# Patient Record
Sex: Male | Born: 1945 | Race: White | Hispanic: No | Marital: Married | Smoking: Former smoker
Health system: Southern US, Community
[De-identification: ages and names within clinical notes are randomized; demographics above are authoritative.]

## PROBLEM LIST (undated history)

## (undated) DIAGNOSIS — I96 Gangrene, not elsewhere classified: Secondary | ICD-10-CM

## (undated) DIAGNOSIS — J189 Pneumonia, unspecified organism: Secondary | ICD-10-CM

## (undated) DIAGNOSIS — C801 Malignant (primary) neoplasm, unspecified: Secondary | ICD-10-CM

## (undated) DIAGNOSIS — Z9981 Dependence on supplemental oxygen: Secondary | ICD-10-CM

## (undated) DIAGNOSIS — J449 Chronic obstructive pulmonary disease, unspecified: Secondary | ICD-10-CM

## (undated) DIAGNOSIS — S3992XA Unspecified injury of lower back, initial encounter: Secondary | ICD-10-CM

## (undated) DIAGNOSIS — I10 Essential (primary) hypertension: Secondary | ICD-10-CM

## (undated) DIAGNOSIS — J90 Pleural effusion, not elsewhere classified: Secondary | ICD-10-CM

## (undated) HISTORY — PX: ABDOMINAL SURGERY: SHX537

## (undated) HISTORY — PX: APPENDECTOMY: SHX54

---

## 1992-02-24 DIAGNOSIS — S3992XA Unspecified injury of lower back, initial encounter: Secondary | ICD-10-CM

## 1992-02-24 HISTORY — DX: Unspecified injury of lower back, initial encounter: S39.92XA

## 2012-06-17 ENCOUNTER — Emergency Department (HOSPITAL_COMMUNITY): Payer: BC Managed Care – PPO

## 2012-06-17 ENCOUNTER — Inpatient Hospital Stay (HOSPITAL_COMMUNITY)
Admission: EM | Admit: 2012-06-17 | Discharge: 2012-06-21 | DRG: 541 | Disposition: A | Discharge status: Home / Self Care | Payer: BC Managed Care – PPO | Attending: Internal Medicine | Admitting: Internal Medicine

## 2012-06-17 ENCOUNTER — Encounter (HOSPITAL_COMMUNITY): Payer: Self-pay

## 2012-06-17 DIAGNOSIS — J90 Pleural effusion, not elsewhere classified: Secondary | ICD-10-CM | POA: Diagnosis present

## 2012-06-17 DIAGNOSIS — F17200 Nicotine dependence, unspecified, uncomplicated: Secondary | ICD-10-CM | POA: Diagnosis present

## 2012-06-17 DIAGNOSIS — J9600 Acute respiratory failure, unspecified whether with hypoxia or hypercapnia: Principal | ICD-10-CM | POA: Diagnosis present

## 2012-06-17 DIAGNOSIS — J811 Chronic pulmonary edema: Secondary | ICD-10-CM | POA: Diagnosis present

## 2012-06-17 DIAGNOSIS — I214 Non-ST elevation (NSTEMI) myocardial infarction: Secondary | ICD-10-CM | POA: Diagnosis present

## 2012-06-17 DIAGNOSIS — R634 Abnormal weight loss: Secondary | ICD-10-CM | POA: Diagnosis present

## 2012-06-17 DIAGNOSIS — I4891 Unspecified atrial fibrillation: Secondary | ICD-10-CM | POA: Diagnosis present

## 2012-06-17 DIAGNOSIS — Z681 Body mass index (BMI) 19 or less, adult: Secondary | ICD-10-CM

## 2012-06-17 DIAGNOSIS — Z720 Tobacco use: Secondary | ICD-10-CM

## 2012-06-17 DIAGNOSIS — I10 Essential (primary) hypertension: Secondary | ICD-10-CM | POA: Diagnosis present

## 2012-06-17 DIAGNOSIS — R748 Abnormal levels of other serum enzymes: Secondary | ICD-10-CM | POA: Diagnosis present

## 2012-06-17 DIAGNOSIS — J441 Chronic obstructive pulmonary disease with (acute) exacerbation: Secondary | ICD-10-CM | POA: Diagnosis present

## 2012-06-17 HISTORY — DX: Unspecified injury of lower back, initial encounter: S39.92XA

## 2012-06-17 HISTORY — DX: Gangrene, not elsewhere classified: I96

## 2012-06-17 HISTORY — DX: Pneumonia, unspecified organism: J18.9

## 2012-06-17 LAB — BASIC METABOLIC PANEL
BUN: 20 mg/dL (ref 6–23)
Chloride: 97 mEq/L (ref 96–112)
Creatinine, Ser: 0.83 mg/dL (ref 0.50–1.35)
Glucose, Bld: 114 mg/dL — ABNORMAL HIGH (ref 70–99)
Potassium: 3.8 mEq/L (ref 3.5–5.1)

## 2012-06-17 LAB — CBC WITH DIFFERENTIAL/PLATELET
Eosinophils Absolute: 0.1 10*3/uL (ref 0.0–0.7)
HCT: 45.7 % (ref 39.0–52.0)
Hemoglobin: 15.2 g/dL (ref 13.0–17.0)
Lymphs Abs: 1.2 10*3/uL (ref 0.7–4.0)
MCH: 33.3 pg (ref 26.0–34.0)
Monocytes Absolute: 0.6 10*3/uL (ref 0.1–1.0)
Monocytes Relative: 7 % (ref 3–12)
Neutro Abs: 6.5 10*3/uL (ref 1.7–7.7)
Neutrophils Relative %: 78 % — ABNORMAL HIGH (ref 43–77)
RBC: 4.56 MIL/uL (ref 4.22–5.81)

## 2012-06-17 LAB — TROPONIN I: Troponin I: 0.33 ng/mL (ref <0.30)

## 2012-06-17 MED ORDER — HEPARIN BOLUS VIA INFUSION
4000.0000 [IU] | Freq: Once | INTRAVENOUS | Status: AC
  Administered 2012-06-17: 4000 [IU] via INTRAVENOUS

## 2012-06-17 MED ORDER — LABETALOL HCL 5 MG/ML IV SOLN
10.0000 mg | Freq: Once | INTRAVENOUS | Status: AC
  Administered 2012-06-17: 10 mg via INTRAVENOUS
  Filled 2012-06-17: qty 4

## 2012-06-17 MED ORDER — FUROSEMIDE 10 MG/ML IJ SOLN
40.0000 mg | Freq: Once | INTRAMUSCULAR | Status: AC
  Administered 2012-06-17: 40 mg via INTRAVENOUS
  Filled 2012-06-17: qty 4

## 2012-06-17 MED ORDER — LEVOFLOXACIN 500 MG PO TABS
500.0000 mg | ORAL_TABLET | Freq: Once | ORAL | Status: AC
  Administered 2012-06-17: 500 mg via ORAL
  Filled 2012-06-17: qty 1

## 2012-06-17 MED ORDER — HYDRALAZINE HCL 20 MG/ML IJ SOLN
10.0000 mg | Freq: Once | INTRAMUSCULAR | Status: AC
  Administered 2012-06-17: 10 mg via INTRAVENOUS
  Filled 2012-06-17: qty 1

## 2012-06-17 MED ORDER — NITROGLYCERIN 2 % TD OINT
1.0000 [in_us] | TOPICAL_OINTMENT | Freq: Four times a day (QID) | TRANSDERMAL | Status: DC
  Administered 2012-06-17 – 2012-06-20 (×11): 1 [in_us] via TOPICAL
  Filled 2012-06-17: qty 1
  Filled 2012-06-17: qty 30

## 2012-06-17 MED ORDER — HEPARIN (PORCINE) IN NACL 100-0.45 UNIT/ML-% IJ SOLN
800.0000 [IU]/h | INTRAMUSCULAR | Status: DC
  Administered 2012-06-17: 650 [IU]/h via INTRAVENOUS
  Administered 2012-06-18: 800 [IU]/h via INTRAVENOUS
  Filled 2012-06-17: qty 250

## 2012-06-17 MED ORDER — ASPIRIN 81 MG PO CHEW
324.0000 mg | CHEWABLE_TABLET | Freq: Once | ORAL | Status: AC
  Administered 2012-06-17: 324 mg via ORAL
  Filled 2012-06-17: qty 4

## 2012-06-17 NOTE — ED Notes (Signed)
EMS reports pt has had some increasing SOB for the past 2 months.  Reports went to pcp today.  PCP called EMS because pt's o2sat in 70's and BP elevated.  EMS administered 125mg  solumedrol IV, 10mg  albuterol neb, and 0.5mg  atrovent neb enroute.  Pt alert, oriented, denies any pain.  Reports is breathing easier but still labored.  BP 213/107 in ED>  duoneb still in progress.

## 2012-06-17 NOTE — Progress Notes (Signed)
ANTICOAGULATION CONSULT NOTE - Initial Consult  Pharmacy Consult for Heparin Indication: chest pain/ACS  No Known Allergies  Patient Measurements: Height: 5\' 8"  (172.7 cm) Weight: 122 lb (55.339 kg) IBW/kg (Calculated) : 68.4  Vital Signs: Temp: 98.2 F (36.8 C) (04/25 1557) Temp src: Oral (04/25 1854) BP: 164/125 mmHg (04/25 1951) Pulse Rate: 71 (04/25 1951)  Labs:  Recent Labs  06/17/12 1607  HGB 15.2  HCT 45.7  PLT 166  CREATININE 0.83  TROPONINI 0.42*   Estimated Creatinine Clearance: 69.4 ml/min (by C-G formula based on Cr of 0.83).  Medical History: Past Medical History  Diagnosis Date  . Pneumonia   . Gangrene   . Back injury 1994   Medications:  Infusions:  . heparin 650 Units/hr (06/17/12 1940)  . [COMPLETED] heparin Stopped (06/17/12 1944)   Assessment: 67yo male started on IV heparin for ACS.  Platelets OK. Goal of Therapy:  Heparin level 0.3-0.7 units/ml Monitor platelets by anticoagulation protocol: Yes   Plan:  Give 4000 units bolus x 1 Start heparin infusion at 650 units/hr Check anti-Xa level in 6 hours and daily while on heparin Continue to monitor H&H and platelets  Valrie Hart A 06/17/2012,7:52 PM

## 2012-06-17 NOTE — ED Notes (Signed)
Pt's breathing treatment complete, pt on 02 at 2liters,  02 sat 100% at present.  Will continue to monitor.

## 2012-06-17 NOTE — H&P (Signed)
[facility] History and Physical  Aldridge Krzyzanowski ZOX:[id number] DOB: [birth year] DOA: 06/17/2012   PCP: He does not have a PCP but was seen by a provider at [facility] today Specialists: None  Chief Complaint: Shortness of breath, worsening over the last 3 months  HPI: Dominic Martinez is a 67 y.o. male with a past medical history that is unremarkable because the patient doesn't follow up with the any providers. He used to take medications for high blood pressure till about 15-20 years ago, but then he stopped taking it. He also has a history of back injury about 20 years ago. Patient presents with complaints of worsening shortness of breath over the last 2-3 months. 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 He just couldn't do it and was getting more short of breath. So, he went to see a provider today, and he was referred to the emergency department. He has had a cough with thick, whitish expectoration. Denies any yellow sputum. Denies any blood in the sputum. Denies any fever or chills. Has had wheezing. He also has had left-sided chest pain 4 year to which comes and goes. It's relieved by rubbing that area. Not particularly associated with exertion. The pain is achy pain in his cell currently 4/10 in intensity. Denies any leg swelling. No dizziness. Doesn't take any medications at home. Continues to smoke cigarettes.  Home Medications: Prior to Admission medications   Medication Sig Start Date End Date Taking? Authorizing Provider  acetaminophen (TYLENOL) 500 MG tablet Take 500-1,000 mg by mouth daily as needed for pain (Only used on occasion for pain).   Yes Historical Provider, MD    Allergies: No Known Allergies  Past Medical History: Past Medical History  Diagnosis Date  . Pneumonia   . Gangrene   . Back injury 1994    Past Surgical History  Procedure Laterality Date  .  Appendectomy    . Abdominal surgery      Social History:  reports that he has been smoking.  He does not have any smokeless tobacco history on file. He reports that he does not drink alcohol or use illicit drugs.  Living Situation: Lives with his wife Activity Level: Usually independent with daily activities, but less so, in the last couple of months   Family History:  Family History  Problem Relation Age of Onset  . Heart attack Brother   . Coronary artery disease Brother   . Cancer Brother      Review of Systems - History obtained from the patient General ROS: positive for  - fatigue Psychological ROS: negative Ophthalmic ROS: negative ENT ROS: negative Allergy and Immunology ROS: negative Hematological and Lymphatic ROS: negative Endocrine ROS: negative Respiratory ROS: as in hpi Cardiovascular ROS: as in hpi Gastrointestinal ROS: no abdominal pain, change in bowel habits, or black or bloody stools Genito-Urinary ROS: no dysuria, trouble voiding, or hematuria Musculoskeletal ROS: negative Neurological ROS: no TIA or stroke symptoms Dermatological ROS: negative  Physical Examination  Filed Vitals:   06/17/12 1900 06/17/12 1906 06/17/12 1920 06/17/12 1951  BP: 198/109 202/96 198/98 164/125  Pulse: 84 79 75 71  Temp:      TempSrc:      Resp: 28 22 27 26   Height:      Weight:      SpO2: 92% 93% 93% 93%    General appearance: alert, cooperative,  appears stated age and no distress Head: Normocephalic, without obvious abnormality, atraumatic Eyes: conjunctivae/corneas clear. PERRL, EOM's intact.  Throat: lips, mucosa, and tongue normal; teeth and gums normal Neck: no adenopathy, no carotid bruit, no JVD, supple, symmetrical, trachea midline and thyroid not enlarged, symmetric, no tenderness/mass/nodules Back: symmetric, no curvature. ROM normal. No CVA tenderness. Resp: Few wheezes bilaterally. Crackles bilateral bases. Few rhonchi as well Chest wall: no  tenderness Cardio: regular rate and rhythm, S1, S2 normal, no murmur, click, rub or gallop GI: soft, non-tender; bowel sounds normal; no masses,  no organomegaly Extremities: edema Very minimal edema Pulses: 2+ and symmetric Skin: Skin color, texture, turgor normal. No rashes or lesions Lymph nodes: Cervical, supraclavicular, and axillary nodes normal. Neurologic: He is alert and oriented x3. No focal neurological deficits are present.  Laboratory Data: Results for orders placed during the hospital encounter of 06/17/12 (from the past 48 hour(s))  CBC WITH DIFFERENTIAL     Status: Abnormal   Collection Time    06/17/12  4:07 PM      Result Value Range   WBC 8.4  4.0 - 10.5 K/uL   RBC 4.56  4.22 - 5.81 MIL/uL   Hemoglobin 15.2  13.0 - 17.0 g/dL   HCT 16.1  09.6 - 04.5 %   MCV 100.2 (*) 78.0 - 100.0 fL   MCH 33.3  26.0 - 34.0 pg   MCHC 33.3  30.0 - 36.0 g/dL   RDW 40.9  81.1 - 91.4 %   Platelets 166  150 - 400 K/uL   Neutrophils Relative 78 (*) 43 - 77 %   Neutro Abs 6.5  1.7 - 7.7 K/uL   Lymphocytes Relative 14  12 - 46 %   Lymphs Abs 1.2  0.7 - 4.0 K/uL   Monocytes Relative 7  3 - 12 %   Monocytes Absolute 0.6  0.1 - 1.0 K/uL   Eosinophils Relative 1  0 - 5 %   Eosinophils Absolute 0.1  0.0 - 0.7 K/uL   Basophils Relative 0  0 - 1 %   Basophils Absolute 0.0  0.0 - 0.1 K/uL  BASIC METABOLIC PANEL     Status: Abnormal   Collection Time    06/17/12  4:07 PM      Result Value Range   Sodium 137  135 - 145 mEq/L   Potassium 3.8  3.5 - 5.1 mEq/L   Chloride 97  96 - 112 mEq/L   CO2 35 (*) 19 - 32 mEq/L   Glucose, Bld 114 (*) 70 - 99 mg/dL   BUN 20  6 - 23 mg/dL   Creatinine, Ser 7.82  0.50 - 1.35 mg/dL   Calcium 8.8  8.4 - 95.6 mg/dL   GFR calc non Af Amer >90  >90 mL/min   GFR calc Af Amer >90  >90 mL/min   Comment:            The eGFR has been calculated     using the CKD EPI equation.     This calculation has not been     validated in all clinical     situations.      eGFR's persistently     <90 mL/min signify     possible Chronic Kidney Disease.  TROPONIN I     Status: Abnormal   Collection Time    06/17/12  4:07 PM      Result Value Range   Troponin I 0.42 (*) <0.30 ng/mL  Comment:            Due to the release kinetics of cTnI,     a negative result within the first hours     of the onset of symptoms does not rule out     myocardial infarction with certainty.     If myocardial infarction is still suspected,     repeat the test at appropriate intervals.     CRITICAL RESULT CALLED TO, READ BACK BY AND VERIFIED WITH:     HUTCHENS,L ON 06/17/12 AT 1705 BY LOY,C  PRO B NATRIURETIC PEPTIDE     Status: Abnormal   Collection Time    06/17/12  4:07 PM      Result Value Range   Pro B Natriuretic peptide (BNP) 1160.0 (*) 0 - 125 pg/mL    Radiology Reports: Ct Chest Wo Contrast  06/17/2012  *RADIOLOGY REPORT*  Clinical Data: Hypoxia  CT CHEST WITHOUT CONTRAST  Technique:  Multidetector CT imaging of the chest was performed following the standard protocol without IV contrast.  Comparison: Chest radiograph same date  Findings: There is pulmonary hyperinflation with diffuse severe emphysematous change.  Small right and trace left pleural effusions are noted.  There are areas of patchy airspace opacity predominately involving the right upper lobe but also in other lobes, with areas of bronchial wall thickening and interlobular septal thickening.  Heart size is normal.  Multiple prominent mediastinal and hilar lymph nodes are identified, representative pretracheal node 1.1 cm.  Mild atheromatous aortic calcification is present.  Pectus excavatum deformity is noted.  Degenerative changes are noted in the spine.  Age indeterminate T11 compression fracture noted with lower thoracic spine central endplate mild compression deformity.  IMPRESSION: Evidence of severe COPD with superimposed ground-glass airspace opacities and pleural effusions, most likely indicating  superimposed edema.  Acute on chronic bronchitis and/or infectious/inflammatory airspace disease could have a similar appearance.   Original Report Authenticated By: Christiana Pellant, M.D.    Dg Chest Portable 1 View  06/17/2012  *RADIOLOGY REPORT*  Clinical Data: Shortness of breath.  Hypertension.  PORTABLE CHEST - 1 VIEW  Comparison: No priors.  Findings: There are coarse interstitial markings throughout the upper lobes of the lungs bilaterally, particularly in the apices. Coarse interstitial markings are also noted throughout the periphery of the lungs bilaterally, particularly in the lung bases where there are multiple Kerley B lines.  There does not appear to be cephalization of the pulmonary vasculature to suggest that these findings are related to pulmonary edema at this time. Additionally, heart size is normal.  The possibility of air space disease in the upper lobes of the lungs bilaterally and at the right base medially is not excluded.  No definite pleural effusions.  Atherosclerosis of the thoracic aorta.  IMPRESSION: 1.  Unusual appearance of the lung parenchyma, as above.  Although there is a possibility of acute multilobar pneumonia involving predominately the upper lobes of the lungs and the medial right base, findings have an appearance suggestive of a chronic process, potentially interstitial lung disease. Clinical correlation is recommended, and further evaluation with high-resolution chest CT is suggested to better evaluate these findings. 2.  Atherosclerosis.   Original Report Authenticated By: Trudie Reed, M.D.     Electrocardiogram: EKG shows sinus rhythm at 79 beats per minute. Normal axis. Normal intervals. T inversion in lead V3. No older EKG available for comparison.  Problem List  Principal Problem:   COPD with acute exacerbation Active Problems:   NSTEMI (  non-ST elevated myocardial infarction)   Accelerated hypertension   Pulmonary edema   Assessment: This is a  67 year old, Caucasian male, who looks older than his stated age, presents with worsening shortness of breath over the last 2-3 months. The etiology for his symptoms is multifactorial. There is a component of COPD exacerbation. But CT shows pleural effusions bilaterally. This suggests more of CHF/pulmonary edema pattern. His BNP is also elevated.  Plan: #1 dyspnea most likely due to a combination of COPD exacerbation and pulmonary edema. He will be treated with nebulizer treatments, steroids, antibiotics, and will be given Lasix intravenously. Please see below for the rest of the plan.  #2 COPD exacerbation, acute: Nebulizer treatments, steroids, and antibiotics will be provided. Smoking cessation counseling has been provided. Nicotine patch will be utilized for his tobacco abuse. He may benefit from inhaled steroids as well and this can be considered in the next few days.  #3 possible non-STEMI with Pulmonary edema: Troponin will be repeated. He's been given aspirin. Will provide Beta blockers. Heparin has been initiated as well. Have discussed with cardiology fellow on call and he will evaluate patient when he arrives at [facility]. He'll be transferred to [facility] due to lack of cardiology coverage here over the weekend. EKG shows nonspecific findings. Echocardiogram will be obtained. Lipid panel will also be checked. ACEI can be considered if he has systolic dysfunction.  #4 accelerated hypertension: He'll be given nitroglycerin ointment to begin with. He'll be given hydralazine on as needed basis. Beta blockers will be initiated. If blood pressures not well controlled despite these interventions he may require a nitroglycerin infusion. But should be cautious with reducing his pressure too aggressively, because it's possible the patient may be living with a high blood pressure as he has not been seen by any doctor in many many years and does carry a history of hypertension.  #5 tobacco abuse: As above.  Nicotine patch.  DVT Prophylaxis: He'll be on intravenous heparin Code Status: Full code Family Communication: Discussed with the patient and his daughters  Disposition Plan: Transfer to [facility].   Further management decisions will depend on results of further testing and patient's response to treatment.  [facility]  [facility] Pager [phone]  If 7PM-7AM, please contact night-coverage [email or web address] Password [facility]  06/17/2012, 7:59 PM

## 2012-06-17 NOTE — ED Provider Notes (Signed)
History     CSN: [id number]  Arrival date & time 06/17/12  1556   First MD Initiated Contact with Patient 06/17/12 1615      Chief Complaint  Patient presents with  . Shortness of Breath  . Hypertension    (Consider location/radiation/quality/duration/timing/severity/associated sxs/prior treatment) HPI Patient presents with dyspnea, both at rest and with exertion.  Symptoms began approximately 2 months ago, without clear precipitant, but he notes one prior episode of pneumonia. Initially symptoms were minor, but over the interval symptoms have become incapacitating.  There is significant dyspnea on exertion, though no pain, no fever, no cough. The patient smokes, was counseled to stop. The patient saw a physician today, was sent here for further evaluation and management after he was hypoxic in the office with oxygen saturation in the 70s.  Additionally, the patient is hypertensive, with a systolic greater than 200, diastolic greater than 100. Past Medical History  Diagnosis Date  . Pneumonia   . Gangrene     Past Surgical History  Procedure Laterality Date  . Appendectomy    . Abdominal surgery      No family history on file.  History  Substance Use Topics  . Smoking status: Current Every Day Smoker  . Smokeless tobacco: Not on file  . Alcohol Use: No      Review of Systems  Constitutional:       Per HPI, otherwise negative  HENT:       Per HPI, otherwise negative  Respiratory:       Per HPI, otherwise negative  Cardiovascular:       Per HPI, otherwise negative  Gastrointestinal: Negative for vomiting.  Endocrine:       Negative aside from HPI  Genitourinary:       Neg aside from HPI   Musculoskeletal:       Per HPI, otherwise negative  Skin: Negative.   Neurological: Negative for syncope.    Allergies  Review of patient's allergies indicates no known allergies.  Home Medications  No current outpatient prescriptions on file.  BP 213/107  Pulse  76  Temp(Src) 98.2 F (36.8 C) (Oral)  Resp 24  Ht 5\' 8"  (1.727 m)  Wt 122 lb (55.339 kg)  BMI 18.55 kg/m2  SpO2 99%  Physical Exam  Nursing note and vitals reviewed. Constitutional: He is oriented to person, place, and time. He appears well-developed. No distress.  HENT:  Head: Normocephalic and atraumatic.  Eyes: Conjunctivae and EOM are normal.  Cardiovascular: Normal rate and regular rhythm.   Pulmonary/Chest: No stridor. Tachypnea noted. No respiratory distress. He has decreased breath sounds in the right upper field and the right lower field.  Pectus excavatum  Abdominal: He exhibits no distension.  Musculoskeletal: He exhibits no edema.  Neurological: He is alert and oriented to person, place, and time.  Skin: Skin is warm and dry.  Psychiatric: He has a normal mood and affect.    ED Course  Procedures (including critical care time)  Labs Reviewed  CBC WITH DIFFERENTIAL - Abnormal; Notable for the following:    MCV 100.2 (*)    Neutrophils Relative 78 (*)    All other components within normal limits  BASIC METABOLIC PANEL  TROPONIN I  PRO B NATRIURETIC PEPTIDE   Dg Chest Portable 1 View  06/17/2012  *RADIOLOGY REPORT*  Clinical Data: Shortness of breath.  Hypertension.  PORTABLE CHEST - 1 VIEW  Comparison: No priors.  Findings: There are coarse interstitial markings throughout the  upper lobes of the lungs bilaterally, particularly in the apices. Coarse interstitial markings are also noted throughout the periphery of the lungs bilaterally, particularly in the lung bases where there are multiple Kerley B lines.  There does not appear to be cephalization of the pulmonary vasculature to suggest that these findings are related to pulmonary edema at this time. Additionally, heart size is normal.  The possibility of air space disease in the upper lobes of the lungs bilaterally and at the right base medially is not excluded.  No definite pleural effusions.  Atherosclerosis of the  thoracic aorta.  IMPRESSION: 1.  Unusual appearance of the lung parenchyma, as above.  Although there is a possibility of acute multilobar pneumonia involving predominately the upper lobes of the lungs and the medial right base, findings have an appearance suggestive of a chronic process, potentially interstitial lung disease. Clinical correlation is recommended, and further evaluation with high-resolution chest CT is suggested to better evaluate these findings. 2.  Atherosclerosis.   Original Report Authenticated By: Trudie Reed, M.D.      No diagnosis found.   Pulse ox 96% with supplemental oxygen abnormal Cardiac 75 sinus rhythm normal     Date: 06/17/2012  Rate: 79  Rhythm: normal sinus rhythm  QRS Axis: normal  Intervals: normal  ST/T Wave abnormalities: nonspecific T wave changes  Conduction Disutrbances:none  Narrative Interpretation:   Old EKG Reviewed: none available ABNORMAL  Initial CXR abnormal - CT ordered   Update: Patient appears calm, though he is still tachycardic, hypertensive.  Update: Patient's troponin is 0.4.  ECG is nonischemic, though there are unremarkable T wave changes, and he is hypertensive, there is some suspicion of demand ischemia.  Patient will receive heparin, aspirin, beta blocker.  Nitro paste applied as well.  MDM  This elderly male with long-term smoking history, recent progression of dyspnea now presents hypertensive, tachypneic, hypoxic.  The patient's ECG is not overtly ischemic, but his troponin is elevated.  X-ray does not demonstrate pneumonia, but her change consistent with interstitial lung disease or bronchitis.  Given the significance of his medical condition, he was admitted for further evaluation and management after initiation of anticoagulant, beta blocker therapy.   CRITICAL CARE Performed by: Gerhard Munch   Total critical care time: 40  Critical care time was exclusive of separately billable procedures and  treating other patients.  Critical care was necessary to treat or prevent imminent or life-threatening deterioration.  Critical care was time spent personally by me on the following activities: development of treatment plan with patient and/or surrogate as well as nursing, discussions with consultants, evaluation of patient's response to treatment, examination of patient, obtaining history from patient or surrogate, ordering and performing treatments and interventions, ordering and review of laboratory studies, ordering and review of radiographic studies, pulse oximetry and re-evaluation of patient's condition.   Gerhard Munch, MD 06/17/12 2014

## 2012-06-17 NOTE — ED Notes (Signed)
Spoke with danielle with [facility] and she states the truck is enroute to our facility at this time.

## 2012-06-18 DIAGNOSIS — J9600 Acute respiratory failure, unspecified whether with hypoxia or hypercapnia: Principal | ICD-10-CM

## 2012-06-18 DIAGNOSIS — Z720 Tobacco use: Secondary | ICD-10-CM | POA: Diagnosis present

## 2012-06-18 DIAGNOSIS — R7989 Other specified abnormal findings of blood chemistry: Secondary | ICD-10-CM

## 2012-06-18 DIAGNOSIS — I509 Heart failure, unspecified: Secondary | ICD-10-CM

## 2012-06-18 DIAGNOSIS — I10 Essential (primary) hypertension: Secondary | ICD-10-CM

## 2012-06-18 DIAGNOSIS — I214 Non-ST elevation (NSTEMI) myocardial infarction: Secondary | ICD-10-CM

## 2012-06-18 DIAGNOSIS — J811 Chronic pulmonary edema: Secondary | ICD-10-CM

## 2012-06-18 LAB — COMPREHENSIVE METABOLIC PANEL
AST: 16 U/L (ref 0–37)
CO2: 34 mEq/L — ABNORMAL HIGH (ref 19–32)
Calcium: 9 mg/dL (ref 8.4–10.5)
Creatinine, Ser: 0.8 mg/dL (ref 0.50–1.35)
GFR calc non Af Amer: >90 mL/min (ref >90)
Total Protein: 6.9 g/dL (ref 6.0–8.3)

## 2012-06-18 LAB — CBC
HCT: 45.2 % (ref 39.0–52.0)
Hemoglobin: 15.2 g/dL (ref 13.0–17.0)
MCH: 32 pg (ref 26.0–34.0)
MCHC: 33.6 g/dL (ref 30.0–36.0)

## 2012-06-18 LAB — TROPONIN I
Troponin I: 0.3 ng/mL (ref <0.30)
Troponin I: <0.3 ng/mL (ref <0.30)
Troponin I: <0.3 ng/mL (ref <0.30)

## 2012-06-18 LAB — LIPID PANEL
HDL: 42 mg/dL (ref >39)
LDL Cholesterol: 85 mg/dL (ref 0–99)
Total CHOL/HDL Ratio: 3.2 RATIO
Triglycerides: 45 mg/dL (ref <150)

## 2012-06-18 LAB — TSH: TSH: 0.381 u[IU]/mL (ref 0.350–4.500)

## 2012-06-18 LAB — MRSA PCR SCREENING: MRSA by PCR: NEGATIVE

## 2012-06-18 MED ORDER — PNEUMOCOCCAL VAC POLYVALENT 25 MCG/0.5ML IJ INJ
0.5000 mL | INJECTION | INTRAMUSCULAR | Status: DC
  Filled 2012-06-18: qty 0.5

## 2012-06-18 MED ORDER — ASPIRIN EC 81 MG PO TBEC
81.0000 mg | DELAYED_RELEASE_TABLET | Freq: Every day | ORAL | Status: DC
  Administered 2012-06-18 – 2012-06-21 (×4): 81 mg via ORAL
  Filled 2012-06-18 (×4): qty 1

## 2012-06-18 MED ORDER — PANTOPRAZOLE SODIUM 40 MG PO TBEC
40.0000 mg | DELAYED_RELEASE_TABLET | Freq: Every day | ORAL | Status: DC
  Administered 2012-06-18 – 2012-06-20 (×3): 40 mg via ORAL
  Filled 2012-06-18 (×3): qty 1

## 2012-06-18 MED ORDER — MORPHINE SULFATE 2 MG/ML IJ SOLN: 2.0000 mg | INTRAMUSCULAR | Status: DC | PRN

## 2012-06-18 MED ORDER — ONDANSETRON HCL 4 MG/2ML IJ SOLN: 4.0000 mg | Freq: Four times a day (QID) | INTRAMUSCULAR | Status: DC | PRN

## 2012-06-18 MED ORDER — HEPARIN BOLUS VIA INFUSION
1500.0000 [IU] | Freq: Once | INTRAVENOUS | Status: AC
  Administered 2012-06-18: 1500 [IU] via INTRAVENOUS
  Filled 2012-06-18: qty 1500

## 2012-06-18 MED ORDER — ACETAMINOPHEN 325 MG PO TABS: 650.0000 mg | ORAL_TABLET | Freq: Four times a day (QID) | ORAL | Status: DC | PRN

## 2012-06-18 MED ORDER — CHRONIC LUNG DISEASE PATIENT EDUCATION BOOK
Freq: Once | Status: AC
Start: 2012-06-18 — End: 2012-06-18
  Administered 2012-06-18: 15:00:00
  Filled 2012-06-18: qty 1

## 2012-06-18 MED ORDER — ALBUTEROL SULFATE (5 MG/ML) 0.5% IN NEBU
2.5000 mg | INHALATION_SOLUTION | RESPIRATORY_TRACT | Status: DC
  Administered 2012-06-18 – 2012-06-20 (×15): 2.5 mg via RESPIRATORY_TRACT
  Filled 2012-06-18 (×16): qty 0.5

## 2012-06-18 MED ORDER — GUAIFENESIN-DM 100-10 MG/5ML PO SYRP
5.0000 mL | ORAL_SOLUTION | ORAL | Status: DC | PRN
  Filled 2012-06-18: qty 5

## 2012-06-18 MED ORDER — METOPROLOL TARTRATE 25 MG PO TABS
25.0000 mg | ORAL_TABLET | Freq: Two times a day (BID) | ORAL | Status: DC
  Administered 2012-06-18 – 2012-06-20 (×6): 25 mg via ORAL
  Filled 2012-06-18 (×7): qty 1

## 2012-06-18 MED ORDER — SODIUM CHLORIDE 0.9 % IJ SOLN
3.0000 mL | Freq: Two times a day (BID) | INTRAMUSCULAR | Status: DC
  Administered 2012-06-18 – 2012-06-21 (×5): 3 mL via INTRAVENOUS

## 2012-06-18 MED ORDER — ONDANSETRON HCL 4 MG PO TABS: 4.0000 mg | ORAL_TABLET | Freq: Four times a day (QID) | ORAL | Status: DC | PRN

## 2012-06-18 MED ORDER — ATORVASTATIN CALCIUM 80 MG PO TABS
80.0000 mg | ORAL_TABLET | Freq: Every day | ORAL | Status: DC
  Administered 2012-06-18 – 2012-06-21 (×5): 80 mg via ORAL
  Filled 2012-06-18 (×5): qty 1

## 2012-06-18 MED ORDER — PNEUMOCOCCAL VAC POLYVALENT 25 MCG/0.5ML IJ INJ: 0.5000 mL | INJECTION | INTRAMUSCULAR | Status: DC | PRN

## 2012-06-18 MED ORDER — OXYCODONE HCL 5 MG PO TABS: 5.0000 mg | ORAL_TABLET | ORAL | Status: DC | PRN

## 2012-06-18 MED ORDER — ACETAMINOPHEN 650 MG RE SUPP: 650.0000 mg | Freq: Four times a day (QID) | RECTAL | Status: DC | PRN

## 2012-06-18 MED ORDER — ENOXAPARIN SODIUM 40 MG/0.4ML ~~LOC~~ SOLN
40.0000 mg | Freq: Every day | SUBCUTANEOUS | Status: DC
  Filled 2012-06-18: qty 0.4

## 2012-06-18 MED ORDER — METHYLPREDNISOLONE SODIUM SUCC 125 MG IJ SOLR
60.0000 mg | Freq: Three times a day (TID) | INTRAMUSCULAR | Status: DC
  Administered 2012-06-18 – 2012-06-19 (×6): 60 mg via INTRAVENOUS
  Filled 2012-06-18 (×8): qty 0.96

## 2012-06-18 MED ORDER — ENOXAPARIN SODIUM 40 MG/0.4ML ~~LOC~~ SOLN
40.0000 mg | Freq: Every day | SUBCUTANEOUS | Status: DC
  Administered 2012-06-18 – 2012-06-20 (×3): 40 mg via SUBCUTANEOUS
  Filled 2012-06-18 (×4): qty 0.4

## 2012-06-18 MED ORDER — FUROSEMIDE 10 MG/ML IJ SOLN
40.0000 mg | Freq: Two times a day (BID) | INTRAMUSCULAR | Status: DC
  Administered 2012-06-18 – 2012-06-20 (×5): 40 mg via INTRAVENOUS
  Filled 2012-06-18 (×7): qty 4

## 2012-06-18 MED ORDER — GUAIFENESIN ER 600 MG PO TB12
600.0000 mg | ORAL_TABLET | Freq: Two times a day (BID) | ORAL | Status: DC
  Administered 2012-06-18 – 2012-06-21 (×8): 600 mg via ORAL
  Filled 2012-06-18 (×9): qty 1

## 2012-06-18 MED ORDER — LEVOFLOXACIN 500 MG PO TABS
500.0000 mg | ORAL_TABLET | Freq: Every day | ORAL | Status: DC
  Administered 2012-06-18 – 2012-06-19 (×2): 500 mg via ORAL
  Filled 2012-06-18 (×3): qty 1

## 2012-06-18 MED ORDER — NICOTINE 14 MG/24HR TD PT24
14.0000 mg | MEDICATED_PATCH | Freq: Every day | TRANSDERMAL | Status: DC
  Administered 2012-06-18 – 2012-06-21 (×4): 14 mg via TRANSDERMAL
  Filled 2012-06-18 (×4): qty 1

## 2012-06-18 MED ORDER — HYDRALAZINE HCL 20 MG/ML IJ SOLN: 10.0000 mg | Freq: Four times a day (QID) | INTRAMUSCULAR | Status: DC | PRN

## 2012-06-18 MED ORDER — LIVING BETTER WITH HEART FAILURE BOOK
Freq: Once | Status: AC
  Administered 2012-06-18: 15:00:00
  Filled 2012-06-18: qty 1

## 2012-06-18 MED ORDER — ALBUTEROL SULFATE (5 MG/ML) 0.5% IN NEBU: 2.5000 mg | INHALATION_SOLUTION | RESPIRATORY_TRACT | Status: DC | PRN

## 2012-06-18 MED ORDER — IPRATROPIUM BROMIDE 0.02 % IN SOLN
0.5000 mg | RESPIRATORY_TRACT | Status: DC
  Administered 2012-06-18 – 2012-06-20 (×15): 0.5 mg via RESPIRATORY_TRACT
  Filled 2012-06-18 (×16): qty 2.5

## 2012-06-18 NOTE — Progress Notes (Signed)
ANTICOAGULATION CONSULT NOTE - Follow Up Consult  Pharmacy Consult for Heparin  Indication: chest pain/ACS  No Known Allergies  Patient Measurements: Height: 5\' 8"  (172.7 cm) Weight: 113 lb 8.6 oz (51.5 kg) IBW/kg (Calculated) : 68.4 Heparin Dosing Weight: 51.5kg  Vital Signs: Temp: 98.3 F (36.8 C) (04/26 0800) Temp src: Oral (04/26 0800) BP: 124/63 mmHg (04/26 0900) Pulse Rate: 75 (04/26 0900)  Labs:  Recent Labs  06/17/12 1607 06/17/12 1929 06/18/12 0149 06/18/12 0911  HGB 15.2  --   --  15.2  HCT 45.7  --   --  45.2  PLT 166  --   --  176  HEPARINUNFRC  --   --   --  <0.10*  CREATININE 0.83  --  0.80  --   TROPONINI 0.42* 0.33* 0.30* <0.30    Estimated Creatinine Clearance: 67.1 ml/min (by C-G formula based on Cr of 0.8).   Medications:  Scheduled:  . albuterol  2.5 mg Nebulization Q4H  . [COMPLETED] aspirin  324 mg Oral Once  . aspirin EC  81 mg Oral Daily  . atorvastatin  80 mg Oral q1800  . [COMPLETED] furosemide  40 mg Intravenous Once  . furosemide  40 mg Intravenous Q12H  . guaiFENesin  600 mg Oral BID  . heparin  1,500 Units Intravenous Once  . [COMPLETED] heparin  4,000 Units Intravenous Once  . [COMPLETED] hydrALAZINE  10 mg Intravenous Once  . ipratropium  0.5 mg Nebulization Q4H  . [COMPLETED] labetalol  10 mg Intravenous Once  . [COMPLETED] levofloxacin  500 mg Oral Once  . levofloxacin  500 mg Oral Daily  . Living Better with Heart Failure Book   Does not apply Once  . methylPREDNISolone (SOLU-MEDROL) injection  60 mg Intravenous Q8H  . metoprolol tartrate  25 mg Oral BID  . nicotine  14 mg Transdermal Daily  . nitroGLYCERIN  1 inch Topical Q6H  . pantoprazole  40 mg Oral Q1200  . sodium chloride  3 mL Intravenous Q12H  . to air is human book   Does not apply Once  . [DISCONTINUED] pneumococcal 23 valent vaccine  0.5 mL Intramuscular Tomorrow-1000    Assessment: 67 y/o M presents with SOB/exercise intolerance found to have +CE's and  started on heparin per rx. HL this AM is <0.10 on 650 units/hr of heparin. No line or bleeding issues per RN. CBC stable. Renal function stable.   Goal of Therapy:  Heparin level 0.3-0.7 units/ml Monitor platelets by anticoagulation protocol: Yes   Plan:  -Heparin 1500 units BOLUS x 1 -Increase heparin infusion to 800 units/hr -HL at 1830 -Daily CBC/HL -Monitor for bleeding  Abran Duke, PharmD Clinical Pharmacist Phone: [phone] Pager: [phone] 06/18/2012 11:55 AM

## 2012-06-18 NOTE — Progress Notes (Signed)
Patient ID: Dominic Martinez, male   DOB: [birth year], 67 y.o.   MRN: [id number]   Please see the complete cardiology consult note done very early this morning. We will await further testing and follow the patient with you.Jerral Bonito, MD

## 2012-06-18 NOTE — Progress Notes (Signed)
INITIAL NUTRITION ASSESSMENT  DOCUMENTATION CODES Per approved criteria  -Severe malnutrition in the context of acute illness or injury -Underweight   INTERVENTION:  No nutrition intervention at this time ---> patient declined RD to follow for nutrition care plan  NUTRITION DIAGNOSIS: Increased nutrient needs related to COPD as evidenced by estimated nutrition needs  Goal: Oral intake with meals to meet >/= 90% of estimated nutrition needs  Monitor:  PO intake, weight, labs, I/O's  Reason for Assessment: Malnutrition Screening Tool Report  67 y.o. male  Admitting Dx: COPD with acute exacerbation  ASSESSMENT: Patient transferred to [facility] from [facility] with complaints of worsening shortness of breath over the last 2-3 months; has had cough with thick, whitish expectoration ---> admitted for further evaluation.  Patient reports he's lost 30 lbs in approximately 3 months, however his daughter does not think he's lost this amount; patient states his appetite has been limited; only consumes 1-2 meal per day; moderate muscle wasting visible to clavicles & shoulders; declining addition of supplements at this time.  Patient meets criteria for severe malnutrition in the context of acute illness or injury given < 50% intake of estimated requirement for > 5 days and moderate muscle loss.  Height: Ht Readings from Last 1 Encounters:  06/18/12 5\' 8"  (1.727 m)    Weight: Wt Readings from Last 1 Encounters:  06/18/12 113 lb 8.6 oz (51.5 kg)    Ideal Body Weight: 154 lb  % Ideal Body Weight: 73%  Wt Readings from Last 10 Encounters:  06/18/12 113 lb 8.6 oz (51.5 kg)    Usual Body Weight: unknown  % Usual Body Weight: ---  BMI:  Body mass index is 17.27 kg/(m^2).  Estimated Nutritional Needs: Kcal: 1500-1700 Protein: 70-80 gm Fluid: 1.5-1.7 L  Skin: Intact  Diet Order: Cardiac  EDUCATION NEEDS: -No education needs identified at this time   Intake/Output Summary (Last 24  hours) at 06/18/12 0809 Last data filed at 06/18/12 0600  Gross per 24 hour  Intake  645.5 ml  Output   1000 ml  Net -354.5 ml    Labs:   Recent Labs Lab 06/17/12 1607 06/18/12 0149  NA 137 141  K 3.8 3.7  CL 97 97  CO2 35* 34*  BUN 20 18  CREATININE 0.83 0.80  CALCIUM 8.8 9.0  GLUCOSE 114* 199*    Scheduled Meds: . albuterol  2.5 mg Nebulization Q4H  . aspirin EC  81 mg Oral Daily  . atorvastatin  80 mg Oral q1800  . furosemide  40 mg Intravenous Q12H  . guaiFENesin  600 mg Oral BID  . ipratropium  0.5 mg Nebulization Q4H  . levofloxacin  500 mg Oral Daily  . methylPREDNISolone (SOLU-MEDROL) injection  60 mg Intravenous Q8H  . metoprolol tartrate  25 mg Oral BID  . nicotine  14 mg Transdermal Daily  . nitroGLYCERIN  1 inch Topical Q6H  . pantoprazole  40 mg Oral Q1200  . pneumococcal 23 valent vaccine  0.5 mL Intramuscular Tomorrow-1000  . sodium chloride  3 mL Intravenous Q12H    Continuous Infusions: . heparin 650 Units/hr (06/18/12 0600)    Past Medical History  Diagnosis Date  . Pneumonia   . Gangrene   . Back injury 1994    Past Surgical History  Procedure Laterality Date  . Appendectomy    . Abdominal surgery      Maureen Chatters, RD, LDN Pager #: [phone] After-Hours Pager #: [phone]

## 2012-06-18 NOTE — Progress Notes (Signed)
Pt transferred into 2610, able to move himself over to bed, Ax4, no pain, VVS on 3L O2. Admitting MD paged and family at bedside. Will continue to monitor.

## 2012-06-18 NOTE — Progress Notes (Signed)
  Echocardiogram 2D Echocardiogram has been performed.  Georgian Co 06/18/2012, 12:19 PM

## 2012-06-18 NOTE — Consult Note (Addendum)
CARDIOLOGY CONSULT NOTE  Patient ID: Dominic Martinez, MRN: [id number], DOB/AGE: 1945/08/21 67 y.o. Admit date: 06/17/2012 Date of Consult: 06/18/2012  Primary Physician: Calla Kicks, MD Primary Cardiologist: Gentry Fitz  Chief Complaint: SOB Reason for Consultation: concern for CHF, +troponin  HPI: 67 y.o. male w/ PMHx significant for tobacco abuse who presented to [facility] on 06/17/2012 with complaints of severe shortness of breath and exercise intolerance. He describes the symptoms as beginning 3 months ago. 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 During this time, he had what he calls "pneumonia"- white phelgm, cough, feeling poorly. Did not seek medical care at this time (in fact, hasn't seen a doctor in over 10 yrs, last visit was due to roofing injury). He states he lost 20+ lbs with the bout of pneumonia.  Over the last several weeks, his symptoms have progressed significantly to the point that he was unable to get the energy to tie his shoe yesterday. Sought medical care at an outpt clinic and was found to be hypoxic in the 70s and was referred to [facility]. Workup there revealed a abnormal chest xray, BP in the 200s,  elevated BNP and troponin prompting transfer to [facility] for further evaluation.  Denies any cardiac history. Denies LE swelling but does endorse PND-like symptoms. No regular OTC or herbal meds. Still smoking. Remote history of ETOH. Denies illicits.  No chest pain with these symptoms. No chest pain with exertion over the last several weeks. Currently feels improved after receiving lasix.  Past Medical History  Diagnosis Date  . Pneumonia   . Gangrene   . Back injury 1994      Surgical History:  Past Surgical History  Procedure Laterality Date  . Appendectomy    . Abdominal surgery       Home Meds: Prior to Admission medications   Medication Sig Start Date End Date Taking? Authorizing Provider   acetaminophen (TYLENOL) 500 MG tablet Take 500-1,000 mg by mouth daily as needed for pain (Only used on occasion for pain).   Yes Historical Provider, MD    Inpatient Medications:  . nitroGLYCERIN  1 inch Topical Q6H   . heparin 650 Units/hr (06/17/12 1940)    Allergies: No Known Allergies  History   Social History  . Marital Status: Married    Spouse Name: [name]    Number of Children: [name]  . Years of Education: [name]   Occupational History  . Not on file.   Social History Main Topics  . Smoking status: Current Every Day Smoker -- 0.50 packs/day for 50 years  . Smokeless tobacco: Not on file  . Alcohol Use: No  . Drug Use: No  . Sexually Active: Not on file   Other Topics Concern  . Not on file   Social History Narrative  . No narrative on file     Family History  Problem Relation Age of Onset  . Heart attack Brother   . Coronary artery disease Brother   . Cancer Brother      Review of Systems: General: negative for chills, fever, night sweats. + weight loss. Cardiovascular: see HPI. Dermatological: negative for rash Respiratory: negative for cough or wheezing Urologic: negative for hematuria Abdominal: negative for nausea, vomiting, diarrhea, bright red blood per rectum, melena, or hematemesis Neurologic: negative for visual changes, syncope, or dizziness All other systems reviewed and are otherwise negative except as noted above.  Labs:  Recent Labs  06/17/12 1607 06/17/12 1929  TROPONINI 0.42* 0.33*   Lab Results  Component Value Date   WBC 8.4 06/17/2012   HGB 15.2 06/17/2012   HCT 45.7 06/17/2012   MCV 100.2* 06/17/2012   PLT 166 06/17/2012    Recent Labs Lab 06/17/12 1607  NA 137  K 3.8  CL 97  CO2 35*  BUN 20  CREATININE 0.83  CALCIUM 8.8  GLUCOSE 114*   No results found for this basename: CHOL, HDL, LDLCALC, TRIG   No results found for this basename: DDIMER    Radiology/Studies:  Ct Chest Wo Contrast  06/17/2012  *RADIOLOGY  REPORT*  Clinical Data: Hypoxia  CT CHEST WITHOUT CONTRAST  Technique:  Multidetector CT imaging of the chest was performed following the standard protocol without IV contrast.  Comparison: Chest radiograph same date  Findings: There is pulmonary hyperinflation with diffuse severe emphysematous change.  Small right and trace left pleural effusions are noted.  There are areas of patchy airspace opacity predominately involving the right upper lobe but also in other lobes, with areas of bronchial wall thickening and interlobular septal thickening.  Heart size is normal.  Multiple prominent mediastinal and hilar lymph nodes are identified, representative pretracheal node 1.1 cm.  Mild atheromatous aortic calcification is present.  Pectus excavatum deformity is noted.  Degenerative changes are noted in the spine.  Age indeterminate T11 compression fracture noted with lower thoracic spine central endplate mild compression deformity.  IMPRESSION: Evidence of severe COPD with superimposed ground-glass airspace opacities and pleural effusions, most likely indicating superimposed edema.  Acute on chronic bronchitis and/or infectious/inflammatory airspace disease could have a similar appearance.   Original Report Authenticated By: Christiana Pellant, M.D.    Dg Chest Portable 1 View  06/17/2012  *RADIOLOGY REPORT*  Clinical Data: Shortness of breath.  Hypertension.  PORTABLE CHEST - 1 VIEW  Comparison: No priors.  Findings: There are coarse interstitial markings throughout the upper lobes of the lungs bilaterally, particularly in the apices. Coarse interstitial markings are also noted throughout the periphery of the lungs bilaterally, particularly in the lung bases where there are multiple Kerley B lines.  There does not appear to be cephalization of the pulmonary vasculature to suggest that these findings are related to pulmonary edema at this time. Additionally, heart size is normal.  The possibility of air space disease in  the upper lobes of the lungs bilaterally and at the right base medially is not excluded.  No definite pleural effusions.  Atherosclerosis of the thoracic aorta.  IMPRESSION: 1.  Unusual appearance of the lung parenchyma, as above.  Although there is a possibility of acute multilobar pneumonia involving predominately the upper lobes of the lungs and the medial right base, findings have an appearance suggestive of a chronic process, potentially interstitial lung disease. Clinical correlation is recommended, and further evaluation with high-resolution chest CT is suggested to better evaluate these findings. 2.  Atherosclerosis.   Original Report Authenticated By: Trudie Reed, M.D.     EKG: sinus, poor R wave progression, anterior TWI, no comparison  Physical Exam: Blood pressure 151/94, pulse 76, temperature 98.1 F (36.7 C), temperature source Oral, resp. rate 16, height 5\' 8"  (1.727 m), weight 51.5 kg (113 lb 8.6 oz), SpO2 99.00%. General: thin, frail appearing. Head: Normocephalic, atraumatic, nares are without discharge.  Neck: Supple. Negative for carotid bruits. External JVP at 8 cm Lungs: poor air movement, faint insp and exp dry sounding crackles Heart: RRR with S1 S2. No murmurs, rubs, or  gallops appreciated. Abdomen: Soft, non-tender, non-distended with normoactive bowel sounds. No hepatomegaly. No rebound/guarding. No obvious abdominal masses. Msk:  Strength and tone appear normal for age. Extremities: No clubbing or cyanosis. No edema.  Distal pedal pulses are 2+ and equal bilaterally. Neuro: Alert and oriented X 3. Moves all extremities spontaneously. Psych:  Responds to questions appropriately with a normal affect.   Problem List 1. Likely congestive heart failure, new diagnosis based upon sxs, elevated BNP, CT scan 2. COPD, severe, untreated 3. Elevated troponin, ACS vs. Demand ischemia 4. Tobacco abuse 5. Hypertension, uncontrolled 6. Weight loss  Assessment and Plan:  67  yo male with several previously undiagnosed, chronic issues including COPD, hypertension and tobacco abuse presenting with progressive severe shortness of breath, hypoxia and found to have elevated BNP, likely interstitial fluid on chest CT, and elevated troponin. Presentation is concerning for new diagnosis of congestive heart failure.  Work up is pending including echo but risk factor profile suggests either ischemic or hypertensive cardiomyopathy. Echo is most important and directs further evaluation. Gentle diuresis with lasix is appropriate as he does not appear to be flagrantly fluid overloaded.   Regarding the mildly elevated troponin, it is not clearly acute coronary syndrome as he does not have acute symptoms symptoms suggestive of acute occlusion (sudden onset chest pain, chest pain with exertion etc.) and his EKG, though abnormal does not meet criteria. Other cause of troponin leak is supply demand mismatch/hypertensive urgency. Nonetheless, obstructive CAD is high on the differential given his risk factor profile, atherosclerosis seen on CT, and heart failure presentation. Continue with ASA, statin, heparin. Trend troponins until down trending. Would be cautious with metoprolol as this might excerbate his pulmonary disease. ACEI or ARB would be good additional agent if needed for blood pressure control. His echo and ischemic evaluation will direct further therapies.  He seems interested in smoking cessation and I strongly supported his thought process in quitting.  Summary of Recommendations 1. Transthoracic echo (pending) 2. Continue aspirin, statin, heparin gtt x 48 hrs if troponins downtrending. Caution with metoprolol, consider bisoprolol as this may have less pulmonary effects. Add ACEI if needed for BP. 3. Gentle diuresis, can change to lasix PO as he is lasix naive and does not appear significantly overloaded.  Will follow up echo and make additional recommendations pending those  results. Thank you for this consult. Please call with questions. [facility] [facility] will continue to follow.   Signed, Adolm Joseph, Avelynn Sellin C. MD 06/18/2012, 12:43 AM

## 2012-06-18 NOTE — Progress Notes (Signed)
[facility] Progress Note [place] TEAM 1 - Stepdown/ICU TEAM   Dominic Martinez GMW:[id number] DOB: Dec 08, 1945 DOA: 06/17/2012 PCP: Calla Kicks, MD  History of present illness: Dominic Martinez is a 67 y.o. male with a past medical history that is unremarkable because the patient doesn't follow up with the any providers. He used to take medications for high blood pressure till about 15-20 years ago, but then he stopped taking it. He also has a history of back injury about 20 years ago. Patient presents with complaints of worsening shortness of breath over the last 2-3 months. 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 He just couldn't do it and was getting more short of breath. So, he went to see a provider today, and he was referred to the emergency department. He has had a cough with thick, whitish expectoration. Denies any yellow sputum. Denies any blood in the sputum. Denies any fever or chills. Has had wheezing. He also has had left-sided chest pain 4 year to which comes and goes. It's relieved by rubbing that area. Not particularly associated with exertion. The pain is achy pain in his cell currently 4/10 in intensity. Denies any leg swelling. No dizziness. Doesn't take any medications at home. Continues to smoke cigarettes.   Assessment/Plan: Principal Problem:   Acute respiratory failure -Likely due to both underlying COPD and superimposed pleural effusions and pulmonary edema -Improving -Have been able to titrate oxygen down  Active Problems:   COPD with acute exacerbation -Continue with nebs steroids and antibiotics    Pulmonary edema -Continue diuresis and followup on echo  Elevated cardiac enzymes -Noted to have mild bump in troponins which may be secondary to stress/dyspnea/pulmonary edema    Accelerated hypertension -Currently much better control    Nicotine abuse -Nicotine patch  played   Code Status: Full code Family Communication: With wife at bedside Disposition Plan: Continue to follow in the step down unit  Consultants: Cardiology  Procedures: None  Antibiotics: Levofloxacin  DVT prophylaxis: Lovenox  HPI/Subjective: Patient states his breathing is improved from yesterday but not back to baseline. No significant cough. He states he's been short of breath for 2-3 months. No history of pedal edema. No coughing up thick white sputum but this is no more that he ordinarily he coughs up.   Objective: Blood pressure 124/73, pulse 65, temperature 98.3 F (36.8 C), temperature source Oral, resp. rate 20, height 5\' 8"  (1.727 m), weight 51.5 kg (113 lb 8.6 oz), SpO2 100.00%.  Intake/Output Summary (Last 24 hours) at 06/18/12 1402 Last data filed at 06/18/12 4403  Gross per 24 hour  Intake  885.5 ml  Output   1375 ml  Net -489.5 ml     Exam: General: No acute respiratory distress Lungs: Clear to auscultation bilaterally without wheezes or crackles Cardiovascular: Regular rate and rhythm without murmur gallop or rub normal S1 and S2 Abdomen: Nontender, nondistended, soft, bowel sounds positive, no rebound, no ascites, no appreciable mass Extremities: No significant cyanosis, clubbing, or edema bilateral lower extremities  Data Reviewed: Basic Metabolic Panel:  Recent Labs Lab 06/17/12 1607 06/18/12 0149  NA 137 141  K 3.8 3.7  CL 97 97  CO2 35* 34*  GLUCOSE 114* 199*  BUN 20 18  CREATININE 0.83 0.80  CALCIUM 8.8 9.0   Liver Function Tests:  Recent Labs Lab 06/18/12 0149  AST 16  ALT 16  ALKPHOS 96  BILITOT 0.5  PROT 6.9  ALBUMIN 3.1*   No results found for this basename: LIPASE, AMYLASE,  in the last 168 hours No results found for this basename: AMMONIA,  in the last 168 hours CBC:  Recent Labs Lab 06/17/12 1607 06/18/12 0911  WBC 8.4 8.3  NEUTROABS 6.5  --   HGB 15.2 15.2  HCT 45.7 45.2  MCV 100.2* 95.2  PLT 166 176    Cardiac Enzymes:  Recent Labs Lab 06/17/12 1607 06/17/12 1929 06/18/12 0149 06/18/12 0911  TROPONINI 0.42* 0.33* 0.30* <0.30   BNP (last 3 results)  Recent Labs  06/17/12 1607  PROBNP 1160.0*   CBG: No results found for this basename: GLUCAP,  in the last 168 hours  Recent Results (from the past 240 hour(s))  MRSA PCR SCREENING     Status: None   Collection Time    06/18/12 12:25 AM      Result Value Range Status   MRSA by PCR NEGATIVE  NEGATIVE Final   Comment:            The GeneXpert MRSA Assay (FDA     approved for NASAL specimens     only), is one component of a     comprehensive MRSA colonization     surveillance program. It is not     intended to diagnose MRSA     infection nor to guide or     monitor treatment for     MRSA infections.     Studies:  Recent x-ray studies have been reviewed in detail by the Attending Physician  Scheduled Meds:  Scheduled Meds: . albuterol  2.5 mg Nebulization Q4H  . aspirin EC  81 mg Oral Daily  . atorvastatin  80 mg Oral q1800  . furosemide  40 mg Intravenous Q12H  . guaiFENesin  600 mg Oral BID  . ipratropium  0.5 mg Nebulization Q4H  . levofloxacin  500 mg Oral Daily  . Living Better with Heart Failure Book   Does not apply Once  . methylPREDNISolone (SOLU-MEDROL) injection  60 mg Intravenous Q8H  . metoprolol tartrate  25 mg Oral BID  . nicotine  14 mg Transdermal Daily  . nitroGLYCERIN  1 inch Topical Q6H  . pantoprazole  40 mg Oral Q1200  . sodium chloride  3 mL Intravenous Q12H  . to air is human book   Does not apply Once   Continuous Infusions: . heparin 800 Units/hr (06/18/12 1200)    Time spent on care of this patient: 35 minutes   Calvert Cantor, MD [phone]  [facility] Office  [phone] Pager - Text Page per Amion as per below:  On-Call/Text Page:      [email or web address]      password [email or web address]  If 7PM-7AM, please contact night-coverage [email or web address] Password [email or web address] 06/18/2012, 2:02  PM   LOS: 1 day

## 2012-06-18 NOTE — Progress Notes (Signed)
Troponin resulted at 0.30, this is lower than previous value and MD aware. Pt has cyclical troponin levels ordered. Will continue to monitor.

## 2012-06-19 LAB — CBC
MCH: 32.2 pg (ref 26.0–34.0)
MCV: 96.1 fL (ref 78.0–100.0)
Platelets: 198 10*3/uL (ref 150–400)
RDW: 14.5 % (ref 11.5–15.5)

## 2012-06-19 LAB — BASIC METABOLIC PANEL
Calcium: 8.8 mg/dL (ref 8.4–10.5)
Creatinine, Ser: 1.28 mg/dL (ref 0.50–1.35)
GFR calc Af Amer: 66 mL/min — ABNORMAL LOW (ref >90)
GFR calc non Af Amer: 57 mL/min — ABNORMAL LOW (ref >90)

## 2012-06-19 MED ORDER — PREDNISONE 50 MG PO TABS
60.0000 mg | ORAL_TABLET | Freq: Every day | ORAL | Status: DC
  Administered 2012-06-20 – 2012-06-21 (×2): 60 mg via ORAL
  Filled 2012-06-19 (×3): qty 1

## 2012-06-19 NOTE — Progress Notes (Addendum)
Patient ID: Dominic Martinez, male   DOB: 05-Oct-1945, 66 y.o.   MRN: [id number]   SUBJECTIVE:  Patient has had some diuresis. He's feeling better. I have reviewed the 2-D echo. The study is extremely technically limited. The right heart cannot be assessed in any way. The left ventricle is seen in only 2 very limited views. However in these views there is a suggestion that LV function may be okay.   Filed Vitals:   06/19/12 0500 06/19/12 0650 06/19/12 0749 06/19/12 0800  BP:  139/83 146/78 137/82  Pulse: 66 73 76 73  Temp:   97.7 F (36.5 C)   TempSrc:   Oral   Resp: 14 18 22 23   Height:      Weight: 114 lb 13.8 oz (52.1 kg)     SpO2: 90% 94% 90% 93%    Intake/Output Summary (Last 24 hours) at 06/19/12 1023 Last data filed at 06/19/12 0800  Gross per 24 hour  Intake    363 ml  Output   1300 ml  Net   -937 ml    LABS: Basic Metabolic Panel:  Recent Labs  40/98/11 1607 06/18/12 0149  NA 137 141  K 3.8 3.7  CL 97 97  CO2 35* 34*  GLUCOSE 114* 199*  BUN 20 18  CREATININE 0.83 0.80  CALCIUM 8.8 9.0   Liver Function Tests:  Recent Labs  06/18/12 0149  AST 16  ALT 16  ALKPHOS 96  BILITOT 0.5  PROT 6.9  ALBUMIN 3.1*   No results found for this basename: LIPASE, AMYLASE,  in the last 72 hours CBC:  Recent Labs  06/17/12 1607 06/18/12 0911 06/19/12 0605  WBC 8.4 8.3 20.9*  NEUTROABS 6.5  --   --   HGB 15.2 15.2 15.0  HCT 45.7 45.2 44.8  MCV 100.2* 95.2 96.1  PLT 166 176 198   Cardiac Enzymes:  Recent Labs  06/18/12 0149 06/18/12 0911 06/18/12 1316  TROPONINI 0.30* <0.30 <0.30   BNP: No components found with this basename: POCBNP,  D-Dimer: No results found for this basename: DDIMER,  in the last 72 hours Hemoglobin A1C: No results found for this basename: HGBA1C,  in the last 72 hours Fasting Lipid Panel:  Recent Labs  06/18/12 0149  CHOL 136  HDL 42  LDLCALC 85  TRIG 45  CHOLHDL 3.2   Thyroid Function Tests:  Recent Labs   06/18/12 0149  TSH 0.381    RADIOLOGY: Ct Chest Wo Contrast  06/17/2012  *RADIOLOGY REPORT*  Clinical Data: Hypoxia  CT CHEST WITHOUT CONTRAST  Technique:  Multidetector CT imaging of the chest was performed following the standard protocol without IV contrast.  Comparison: Chest radiograph same date  Findings: There is pulmonary hyperinflation with diffuse severe emphysematous change.  Small right and trace left pleural effusions are noted.  There are areas of patchy airspace opacity predominately involving the right upper lobe but also in other lobes, with areas of bronchial wall thickening and interlobular septal thickening.  Heart size is normal.  Multiple prominent mediastinal and hilar lymph nodes are identified, representative pretracheal node 1.1 cm.  Mild atheromatous aortic calcification is present.  Pectus excavatum deformity is noted.  Degenerative changes are noted in the spine.  Age indeterminate T11 compression fracture noted with lower thoracic spine central endplate mild compression deformity.  IMPRESSION: Evidence of severe COPD with superimposed ground-glass airspace opacities and pleural effusions, most likely indicating superimposed edema.  Acute on chronic bronchitis and/or infectious/inflammatory airspace disease  could have a similar appearance.   Original Report Authenticated By: Christiana Pellant, M.D.    Dg Chest Portable 1 View  06/17/2012  *RADIOLOGY REPORT*  Clinical Data: Shortness of breath.  Hypertension.  PORTABLE CHEST - 1 VIEW  Comparison: No priors.  Findings: There are coarse interstitial markings throughout the upper lobes of the lungs bilaterally, particularly in the apices. Coarse interstitial markings are also noted throughout the periphery of the lungs bilaterally, particularly in the lung bases where there are multiple Kerley B lines.  There does not appear to be cephalization of the pulmonary vasculature to suggest that these findings are related to pulmonary edema  at this time. Additionally, heart size is normal.  The possibility of air space disease in the upper lobes of the lungs bilaterally and at the right base medially is not excluded.  No definite pleural effusions.  Atherosclerosis of the thoracic aorta.  IMPRESSION: 1.  Unusual appearance of the lung parenchyma, as above.  Although there is a possibility of acute multilobar pneumonia involving predominately the upper lobes of the lungs and the medial right base, findings have an appearance suggestive of a chronic process, potentially interstitial lung disease. Clinical correlation is recommended, and further evaluation with high-resolution chest CT is suggested to better evaluate these findings. 2.  Atherosclerosis.   Original Report Authenticated By: Trudie Reed, M.D.     PHYSICAL EXAM  Patient is comfortable in bed. Lungs reveal decreased breath sounds. Cardiac exam reveals an S1 and S2. The abdomen is soft. There is no peripheral edema.   TELEMETRY:   I reviewed telemetry. There is normal sinus rhythm.   ASSESSMENT AND PLAN:    Acute respiratory failure      The patient is improving. I suspect this is a combination of his COPD with some mild volume overload.    COPD with acute exacerbation    NSTEMI (non-ST elevated myocardial infarction)     There was a very small rise in troponin that is now coming down. I think it is unlikely that he has had a significant acute cardiac event. However I feel it would be appropriate to proceed with an in hospital Myoview scan to be sure that he is assessed completely and risk stratified. I ordered this study for tomorrow. As outlined in my first sentence as above, the 2-D echo is technically extremely limited. There is a hint that LV function may be okay.    Dominic Martinez 06/19/2012 10:23 AM

## 2012-06-19 NOTE — Progress Notes (Signed)
[facility] Progress Note [place] TEAM 1 - Stepdown/ICU TEAM   Byrd Rushlow ZOX:[id number] DOB: [birth year] DOA: 06/17/2012 PCP: Calla Kicks, MD  History of present illness: Dominic Martinez is a 67 y.o. male with a past medical history that is unremarkable because the patient doesn't follow up with the any providers. He used to take medications for high blood pressure till about 15-20 years ago, but then he stopped taking it. He also has a history of back injury about 20 years ago. Patient presents with complaints of worsening shortness of breath over the last 2-3 months. 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 He just couldn't do it and was getting more short of breath. So, he went to see a provider today, and he was referred to the emergency department. He has had a cough with thick, whitish expectoration. Denies any yellow sputum. Denies any blood in the sputum. Denies any fever or chills. Has had wheezing. He also has had left-sided chest pain 4 year to which comes and goes. It's relieved by rubbing that area. Not particularly associated with exertion. The pain is achy pain in his cell currently 4/10 in intensity. Denies any leg swelling. No dizziness. Doesn't take any medications at home. Continues to smoke cigarettes.   Assessment/Plan: Principal Problem:   Acute respiratory failure -Likely due to both underlying COPD and superimposed pleural effusions and pulmonary edema -Improving -noted to be on 5 L of O2 this AM- attempting to titrate oxygen down  Active Problems:   COPD with acute exacerbation -Continue with nebs and antibiotics - taper steroids    Pulmonary edema -Continue diuresis  - ECHO was technically limited but LV function appeared to be stable  Elevated cardiac enzymes -Noted to have mild bump in troponins which may be secondary to stress/dyspnea/pulmonary edema - stress  test tomorrow     Accelerated hypertension -Currently much better control    Nicotine abuse -Nicotine patch placed   Code Status: Full code Family Communication: discussed w/ pt Disposition Plan: Continue to follow in the step down unit  Consultants: Cardiology  Procedures: None  Antibiotics: Levofloxacin  DVT prophylaxis: Lovenox  HPI/Subjective: Patient states he feels about the same as yesterday- beginning to ambulate to the bathroom without much exacerbation of dyspnea. Cough is better.   Objective: Blood pressure 117/61, pulse 79, temperature 97.7 F (36.5 C), temperature source Oral, resp. rate 19, height 5\' 8"  (1.727 m), weight 52.1 kg (114 lb 13.8 oz), SpO2 92.00%.  Intake/Output Summary (Last 24 hours) at 06/19/12 1552 Last data filed at 06/19/12 1400  Gross per 24 hour  Intake    249 ml  Output   1600 ml  Net  -1351 ml     Exam: General: No acute respiratory distress Lungs: mild wheeze b/l, decreased BS at bases Cardiovascular: Regular rate and rhythm without murmur gallop or rub normal S1 and S2 Abdomen: Nontender, nondistended, soft, bowel sounds positive, no rebound, no ascites, no appreciable mass Extremities: No significant cyanosis, clubbing, or edema bilateral lower extremities  Data Reviewed: Basic Metabolic Panel:  Recent Labs Lab 06/17/12 1607 06/18/12 0149 06/19/12 1000  NA 137 141 138  K 3.8 3.7 3.9  CL 97 97 94*  CO2 35* 34* 34*  GLUCOSE 114* 199* 277*  BUN 20 18 38*  CREATININE 0.83 0.80 1.28  CALCIUM 8.8 9.0 8.8   Liver Function Tests:  Recent  Labs Lab 06/18/12 0149  AST 16  ALT 16  ALKPHOS 96  BILITOT 0.5  PROT 6.9  ALBUMIN 3.1*   No results found for this basename: LIPASE, AMYLASE,  in the last 168 hours No results found for this basename: AMMONIA,  in the last 168 hours CBC:  Recent Labs Lab 06/17/12 1607 06/18/12 0911 06/19/12 0605  WBC 8.4 8.3 20.9*  NEUTROABS 6.5  --   --   HGB 15.2 15.2 15.0  HCT  45.7 45.2 44.8  MCV 100.2* 95.2 96.1  PLT 166 176 198   Cardiac Enzymes:  Recent Labs Lab 06/17/12 1607 06/17/12 1929 06/18/12 0149 06/18/12 0911 06/18/12 1316  TROPONINI 0.42* 0.33* 0.30* <0.30 <0.30   BNP (last 3 results)  Recent Labs  06/17/12 1607  PROBNP 1160.0*   CBG: No results found for this basename: GLUCAP,  in the last 168 hours  Recent Results (from the past 240 hour(s))  MRSA PCR SCREENING     Status: None   Collection Time    06/18/12 12:25 AM      Result Value Range Status   MRSA by PCR NEGATIVE  NEGATIVE Final   Comment:            The GeneXpert MRSA Assay (FDA     approved for NASAL specimens     only), is one component of a     comprehensive MRSA colonization     surveillance program. It is not     intended to diagnose MRSA     infection nor to guide or     monitor treatment for     MRSA infections.     Studies:  Recent x-ray studies have been reviewed in detail by the Attending Physician  Scheduled Meds:  Scheduled Meds: . albuterol  2.5 mg Nebulization Q4H  . aspirin EC  81 mg Oral Daily  . atorvastatin  80 mg Oral q1800  . enoxaparin (LOVENOX) injection  40 mg Subcutaneous QHS  . furosemide  40 mg Intravenous Q12H  . guaiFENesin  600 mg Oral BID  . ipratropium  0.5 mg Nebulization Q4H  . levofloxacin  500 mg Oral Daily  . methylPREDNISolone (SOLU-MEDROL) injection  60 mg Intravenous Q8H  . metoprolol tartrate  25 mg Oral BID  . nicotine  14 mg Transdermal Daily  . nitroGLYCERIN  1 inch Topical Q6H  . pantoprazole  40 mg Oral Q1200  . sodium chloride  3 mL Intravenous Q12H   Continuous Infusions:    Time spent on care of this patient: 25 minutes   Calvert Cantor, MD [phone]  [facility] Office  [phone] Pager - Text Page per Amion as per below:  On-Call/Text Page:      [email or web address]      password [email or web address]  If 7PM-7AM, please contact night-coverage [email or web address] Password [email or web address] 06/19/2012, 3:52 PM   LOS: 2 days

## 2012-06-19 NOTE — Progress Notes (Signed)
Utilization review completed.  

## 2012-06-20 ENCOUNTER — Inpatient Hospital Stay (HOSPITAL_COMMUNITY): Payer: BC Managed Care – PPO

## 2012-06-20 DIAGNOSIS — I4891 Unspecified atrial fibrillation: Secondary | ICD-10-CM

## 2012-06-20 DIAGNOSIS — R079 Chest pain, unspecified: Secondary | ICD-10-CM

## 2012-06-20 LAB — BASIC METABOLIC PANEL
Chloride: 98 mEq/L (ref 96–112)
Creatinine, Ser: 1.42 mg/dL — ABNORMAL HIGH (ref 0.50–1.35)
GFR calc Af Amer: 58 mL/min — ABNORMAL LOW (ref >90)
Potassium: 3.7 mEq/L (ref 3.5–5.1)

## 2012-06-20 LAB — CBC
Platelets: 178 10*3/uL (ref 150–400)
RDW: 14.8 % (ref 11.5–15.5)
WBC: 20.4 10*3/uL — ABNORMAL HIGH (ref 4.0–10.5)

## 2012-06-20 MED ORDER — REGADENOSON 0.4 MG/5ML IV SOLN: 0.4000 mg | Freq: Once | INTRAVENOUS | Status: AC

## 2012-06-20 MED ORDER — IPRATROPIUM BROMIDE 0.02 % IN SOLN
0.5000 mg | Freq: Four times a day (QID) | RESPIRATORY_TRACT | Status: DC
  Administered 2012-06-20 – 2012-06-21 (×2): 0.5 mg via RESPIRATORY_TRACT
  Filled 2012-06-20 (×2): qty 2.5

## 2012-06-20 MED ORDER — TECHNETIUM TC 99M SESTAMIBI GENERIC - CARDIOLITE
10.0000 | Freq: Once | INTRAVENOUS | Status: AC | PRN
  Administered 2012-06-20: 10 via INTRAVENOUS

## 2012-06-20 MED ORDER — ALBUTEROL SULFATE (5 MG/ML) 0.5% IN NEBU
2.5000 mg | INHALATION_SOLUTION | Freq: Four times a day (QID) | RESPIRATORY_TRACT | Status: DC
  Administered 2012-06-20 – 2012-06-21 (×2): 2.5 mg via RESPIRATORY_TRACT
  Filled 2012-06-20 (×2): qty 0.5

## 2012-06-20 MED ORDER — TECHNETIUM TC 99M SESTAMIBI GENERIC - CARDIOLITE
30.0000 | Freq: Once | INTRAVENOUS | Status: AC | PRN
  Administered 2012-06-20: 30 via INTRAVENOUS

## 2012-06-20 MED ORDER — LEVOFLOXACIN 250 MG PO TABS
250.0000 mg | ORAL_TABLET | Freq: Every day | ORAL | Status: DC
  Administered 2012-06-20 – 2012-06-21 (×2): 250 mg via ORAL
  Filled 2012-06-20 (×2): qty 1

## 2012-06-20 MED ORDER — REGADENOSON 0.4 MG/5ML IV SOLN
INTRAVENOUS | Status: AC
  Administered 2012-06-20: 0.4 mg via INTRAVENOUS
  Filled 2012-06-20: qty 5

## 2012-06-20 NOTE — Progress Notes (Signed)
Report given to [facility] on 5500.  VS [id number]/74 89% sat on 2L N/C. Transferred via w/c on O2. in stable condition. Belongings and meds brought to new unit

## 2012-06-20 NOTE — Progress Notes (Signed)
[facility] Progress Note [place] TEAM 1 - Stepdown/ICU TEAM   Dominic Martinez WJX:[id number] DOB: [birth year] DOA: 06/17/2012 PCP: Calla Kicks, MD  History of present illness: 67 y.o. male with a past medical history that is unremarkable because the patient see a physician. He used to take medications for high blood pressure till about 15-20 years ago, but then he stopped taking it. He also has a history of back injury about 20 years ago. Patient presented with complaints of worsening shortness of breath over the last 2-3 months. 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 He just couldn't do it and was getting more short of breath. He went to see a doctor and he was referred to the emergency department. He had a cough with thick, whitish expectoration. Denied any yellow sputum. Denied any blood in the sputum. Denied any fever or chills. Had wheezing. He also had left-sided chest pain which comes and goes. Not particularly associated with exertion. Denied any leg swelling. No dizziness. Continued to smoke cigarettes.   Assessment/Plan:  Acute respiratory failure - newly diagnosed severe COPD Clinically the patient is improving - he continues to require high dose oxygen - will begin to ambulate patient and determine if home oxygen will be required - continue nebulizers and antibiotics with slow steroid taper  Pulmonary edema Not likely a significant contributor to his symptoms - appear somewhat hypovolemic at this time - EF appears to be preserved via nuclear meds studies and echo  Elevated cardiac enzymes Noted to have mild bump in troponins which was likely secondary to stress/dyspnea/lung disease - cardiac eval has been unrevealing  Accelerated hypertension Follow without change today  Nicotine abuse Nicotine patch - patient counseled very clearly that he mustn't ever smoke again   Code Status: Full code Family Communication: discussed w/ pt, wife,  and daughter at bedside Disposition Plan: Transfer to a medical bed  Consultants: Cardiology  Procedures: 4/28 - nuclear medicine stress test - Normal myocardial perfusion study. No findings for ischemia or infarction. Ejection fraction calculated at 59%.  Antibiotics: Levofloxacin 4/25 >>  DVT prophylaxis: Lovenox  HPI/Subjective: Patient remains somewhat short of breath, even when sitting in bed.  He does not feel significantly improved when compared to yesterday.  He denies chest pain nausea vomiting abdominal pain fevers or chills.  Objective: Blood pressure 127/60, pulse 88, temperature 97.7 F (36.5 C), temperature source Oral, resp. rate 16, height 5\' 8"  (1.727 m), weight 52.5 kg (115 lb 11.9 oz), SpO2 94.00%.  Intake/Output Summary (Last 24 hours) at 06/20/12 1706 Last data filed at 06/20/12 1500  Gross per 24 hour  Intake    363 ml  Output   1925 ml  Net  -1562 ml   Exam: General: No acute respiratory distress at rest Lungs: Very poor air movement throughout all fields with very distant breath sounds and mild expiratory wheeze appreciable diffusely Cardiovascular: Regular rate and rhythm without murmur gallop or rub - heart sounds are distant Abdomen: Nontender, nondistended, soft, bowel sounds positive, no rebound, no ascites, no appreciable mass Extremities: No significant cyanosis, clubbing, or edema bilateral lower extremities  Data Reviewed: Basic Metabolic Panel:  Recent Labs Lab 06/17/12 1607 06/18/12 0149 06/19/12 1000 06/20/12 0653  NA 137 141 138 140  K 3.8 3.7 3.9 3.7  CL 97 97 94* 98  CO2 35* 34* 34* 38*  GLUCOSE 114* 199* 277* 118*  BUN 20 18 38* 44*  CREATININE 0.83 0.80  1.28 1.42*  CALCIUM 8.8 9.0 8.8 8.6   Liver Function Tests:  Recent Labs Lab 06/18/12 0149  AST 16  ALT 16  ALKPHOS 96  BILITOT 0.5  PROT 6.9  ALBUMIN 3.1*   CBC:  Recent Labs Lab 06/17/12 1607 06/18/12 0911 06/19/12 0605 06/20/12 0653  WBC 8.4 8.3 20.9*  20.4*  NEUTROABS 6.5  --   --   --   HGB 15.2 15.2 15.0 15.3  HCT 45.7 45.2 44.8 46.6  MCV 100.2* 95.2 96.1 97.5  PLT 166 176 198 178   Cardiac Enzymes:  Recent Labs Lab 06/17/12 1607 06/17/12 1929 06/18/12 0149 06/18/12 0911 06/18/12 1316  TROPONINI 0.42* 0.33* 0.30* <0.30 <0.30    Recent Results (from the past 240 hour(s))  MRSA PCR SCREENING     Status: None   Collection Time    06/18/12 12:25 AM      Result Value Range Status   MRSA by PCR NEGATIVE  NEGATIVE Final   Comment:            The GeneXpert MRSA Assay (FDA     approved for NASAL specimens     only), is one component of a     comprehensive MRSA colonization     surveillance program. It is not     intended to diagnose MRSA     infection nor to guide or     monitor treatment for     MRSA infections.     Studies:  Recent x-ray studies have been reviewed in detail by the Attending Physician  Scheduled Meds:  Scheduled Meds: . albuterol  2.5 mg Nebulization Q4H  . aspirin EC  81 mg Oral Daily  . atorvastatin  80 mg Oral q1800  . enoxaparin (LOVENOX) injection  40 mg Subcutaneous QHS  . guaiFENesin  600 mg Oral BID  . ipratropium  0.5 mg Nebulization Q4H  . levofloxacin  250 mg Oral Daily  . metoprolol tartrate  25 mg Oral BID  . nicotine  14 mg Transdermal Daily  . nitroGLYCERIN  1 inch Topical Q6H  . pantoprazole  40 mg Oral Q1200  . predniSONE  60 mg Oral Q breakfast  . sodium chloride  3 mL Intravenous Q12H   Continuous Infusions:    Time spent on care of this patient: 35 minutes   Melani Brisbane T, MD [phone]  [facility] Office  [phone] Pager - Text Page per Amion as per below:  On-Call/Text Page:      [email or web address]      password [email or web address]  If 7PM-7AM, please contact night-coverage [email or web address] Password [email or web address] 06/20/2012, 5:06 PM   LOS: 3 days

## 2012-06-20 NOTE — Progress Notes (Addendum)
Patient ID: Dominic Martinez, male   DOB: 1945-12-20, 67 y.o.   MRN: [id number]   SUBJECTIVE:  No chest pain   Filed Vitals:   06/20/12 0200 06/20/12 0400 06/20/12 0444 06/20/12 0600  BP: 110/72 143/65  121/70  Pulse: 78 74  72  Temp:  97.5 F (36.4 C)    TempSrc:  Oral    Resp: 17 19  20   Height:      Weight:    115 lb 11.9 oz (52.5 kg)  SpO2: 97% 96% 99% 99%    Intake/Output Summary (Last 24 hours) at 06/20/12 0745 Last data filed at 06/19/12 2240  Gross per 24 hour  Intake    489 ml  Output   1650 ml  Net  -1161 ml    LABS: Basic Metabolic Panel:  Recent Labs  95/28/41 0149 06/19/12 1000  NA 141 138  K 3.7 3.9  CL 97 94*  CO2 34* 34*  GLUCOSE 199* 277*  BUN 18 38*  CREATININE 0.80 1.28  CALCIUM 9.0 8.8   Liver Function Tests:  Recent Labs  06/18/12 0149  AST 16  ALT 16  ALKPHOS 96  BILITOT 0.5  PROT 6.9  ALBUMIN 3.1*  CBC:  Recent Labs  06/17/12 1607  06/19/12 0605 06/20/12 0653  WBC 8.4  < > 20.9* 20.4*  NEUTROABS 6.5  --   --   --   HGB 15.2  < > 15.0 15.3  HCT 45.7  < > 44.8 46.6  MCV 100.2*  < > 96.1 97.5  PLT 166  < > 198 178  < > = values in this interval not displayed. Cardiac Enzymes:  Recent Labs  06/18/12 0149 06/18/12 0911 06/18/12 1316  TROPONINI 0.30* <0.30 <0.30   Fasting Lipid Panel:  Recent Labs  06/18/12 0149  CHOL 136  HDL 42  LDLCALC 85  TRIG 45  CHOLHDL 3.2   Thyroid Function Tests:  Recent Labs  06/18/12 0149  TSH 0.381    RADIOLOGY: Ct Chest Wo Contrast  06/17/2012  *RADIOLOGY REPORT*  Clinical Data: Hypoxia  CT CHEST WITHOUT CONTRAST  Technique:  Multidetector CT imaging of the chest was performed following the standard protocol without IV contrast.  Comparison: Chest radiograph same date  Findings: There is pulmonary hyperinflation with diffuse severe emphysematous change.  Small right and trace left pleural effusions are noted.  There are areas of patchy airspace opacity predominately involving  the right upper lobe but also in other lobes, with areas of bronchial wall thickening and interlobular septal thickening.  Heart size is normal.  Multiple prominent mediastinal and hilar lymph nodes are identified, representative pretracheal node 1.1 cm.  Mild atheromatous aortic calcification is present.  Pectus excavatum deformity is noted.  Degenerative changes are noted in the spine.  Age indeterminate T11 compression fracture noted with lower thoracic spine central endplate mild compression deformity.  IMPRESSION: Evidence of severe COPD with superimposed ground-glass airspace opacities and pleural effusions, most likely indicating superimposed edema.  Acute on chronic bronchitis and/or infectious/inflammatory airspace disease could have a similar appearance.   Original Report Authenticated By: Christiana Pellant, M.D.    Dg Chest Portable 1 View  06/17/2012  *RADIOLOGY REPORT*  Clinical Data: Shortness of breath.  Hypertension.  PORTABLE CHEST - 1 VIEW  Comparison: No priors.  Findings: There are coarse interstitial markings throughout the upper lobes of the lungs bilaterally, particularly in the apices. Coarse interstitial markings are also noted throughout the periphery of the lungs bilaterally, particularly  in the lung bases where there are multiple Kerley B lines.  There does not appear to be cephalization of the pulmonary vasculature to suggest that these findings are related to pulmonary edema at this time. Additionally, heart size is normal.  The possibility of air space disease in the upper lobes of the lungs bilaterally and at the right base medially is not excluded.  No definite pleural effusions.  Atherosclerosis of the thoracic aorta.  IMPRESSION: 1.  Unusual appearance of the lung parenchyma, as above.  Although there is a possibility of acute multilobar pneumonia involving predominately the upper lobes of the lungs and the medial right base, findings have an appearance suggestive of a chronic  process, potentially interstitial lung disease. Clinical correlation is recommended, and further evaluation with high-resolution chest CT is suggested to better evaluate these findings. 2.  Atherosclerosis.   Original Report Authenticated By: Trudie Reed, M.D.     PHYSICAL EXAM  Patient is comfortable in bed. Lungs reveal decreased breath sounds. Cardiac exam reveals an S1 and S2. The abdomen is soft. There is No  peripheral edema.    TELEMETRY:   I reviewed telemetry. There is normal sinus rhythm. ECG:  NSR normal ECG    ASSESSMENT AND PLAN:    Acute respiratory failure      The patient is improving. I suspect this is a combination of his COPD with some mild volume overload.    COPD with acute exacerbation  Elevated troponin:  Minimal .3to .40 level will have myovue today    Charlton Haws 06/20/2012 7:45 AM

## 2012-06-20 NOTE — Progress Notes (Signed)
Pt returned from xray with O2 and monitor in place. Pt tolerated procedure well.

## 2012-06-20 NOTE — Progress Notes (Signed)
PT arrived via wheelchair from unit 2600. Pt is in no apparent distress, VSS, and put in bed. Pt was oriented to his room. Pt was given a [place] sandwich.

## 2012-06-20 NOTE — Progress Notes (Signed)
To Nuclear Med via w/c on O2 at 4 L via N/C and monitor. VSS.

## 2012-06-20 NOTE — Progress Notes (Signed)
Nuc scan was normal. IMPRESSION:1. Normal myocardial perfusion study. No findings for ischemia or infarction. 2. Ejection fraction calculated at 59%. Pt/nurse made aware of result. Salena Ortlieb PA-C

## 2012-06-20 NOTE — Progress Notes (Signed)
To xray via W/C on O2 at 2L N/C

## 2012-06-21 LAB — BASIC METABOLIC PANEL
BUN: 41 mg/dL — ABNORMAL HIGH (ref 6–23)
Chloride: 96 mEq/L (ref 96–112)
GFR calc Af Amer: 74 mL/min — ABNORMAL LOW (ref >90)
Glucose, Bld: 119 mg/dL — ABNORMAL HIGH (ref 70–99)
Potassium: 3.9 mEq/L (ref 3.5–5.1)
Sodium: 141 mEq/L (ref 135–145)

## 2012-06-21 LAB — BLOOD GAS, ARTERIAL
Acid-Base Excess: 12.9 mmol/L — ABNORMAL HIGH (ref 0.0–2.0)
Drawn by: 24486
pCO2 arterial: 62.8 mmHg (ref 35.0–45.0)
pH, Arterial: 7.401 (ref 7.350–7.450)
pO2, Arterial: 54.8 mmHg — ABNORMAL LOW (ref 80.0–100.0)

## 2012-06-21 MED ORDER — TIOTROPIUM BROMIDE MONOHYDRATE 18 MCG IN CAPS
18.0000 ug | ORAL_CAPSULE | Freq: Every day | RESPIRATORY_TRACT | Status: DC
  Administered 2012-06-21: 18 ug via RESPIRATORY_TRACT
  Filled 2012-06-21: qty 5

## 2012-06-21 MED ORDER — ATORVASTATIN CALCIUM 80 MG PO TABS: 80.0000 mg | ORAL_TABLET | Freq: Every day | ORAL | Status: DC

## 2012-06-21 MED ORDER — PREDNISONE 10 MG PO TABS: 60.0000 mg | ORAL_TABLET | Freq: Every day | ORAL | Status: DC

## 2012-06-21 MED ORDER — ASPIRIN 81 MG PO TBEC: 81.0000 mg | DELAYED_RELEASE_TABLET | Freq: Every day | ORAL | Status: DC

## 2012-06-21 MED ORDER — ALBUTEROL SULFATE HFA 108 (90 BASE) MCG/ACT IN AERS: 2.0000 | INHALATION_SPRAY | Freq: Four times a day (QID) | RESPIRATORY_TRACT | Status: AC | PRN

## 2012-06-21 MED ORDER — NICOTINE 14 MG/24HR TD PT24: 1.0000 | MEDICATED_PATCH | Freq: Every day | TRANSDERMAL | Status: DC

## 2012-06-21 MED ORDER — ALBUTEROL SULFATE HFA 108 (90 BASE) MCG/ACT IN AERS
2.0000 | INHALATION_SPRAY | Freq: Four times a day (QID) | RESPIRATORY_TRACT | Status: DC
  Filled 2012-06-21 (×2): qty 6.7

## 2012-06-21 MED ORDER — GUAIFENESIN ER 600 MG PO TB12: 600.0000 mg | ORAL_TABLET | Freq: Two times a day (BID) | ORAL | Status: AC

## 2012-06-21 MED ORDER — TIOTROPIUM BROMIDE MONOHYDRATE 18 MCG IN CAPS: 18.0000 ug | ORAL_CAPSULE | Freq: Every day | RESPIRATORY_TRACT | Status: AC

## 2012-06-21 MED ORDER — LEVOFLOXACIN 250 MG PO TABS: 250.0000 mg | ORAL_TABLET | Freq: Every day | ORAL | Status: DC

## 2012-06-21 NOTE — Progress Notes (Addendum)
Pt has walked x2 today with documented SaO2. Spoke with pt re smoking cessation. Sts he is willing. Discussed techniques and gave 1800quitnow resource. Encouraged pt to walk daily with O2. Pt sts he is not sure he will be able to tolerate wearing O2 for forever but he will for a while. Gave Heart Healthy diet. Discussed ex program (most applicable to [facility] instead of [facility]) but pt is not interested in either. Sts he can do that at home. [id number] Ethelda Chick CES, ACSM 1:56 PM 06/21/2012

## 2012-06-21 NOTE — Discharge Summary (Signed)
Physician Discharge Summary  Dominic Martinez WUJ:[id number] DOB: [birth year] DOA: 06/17/2012  PCP: Calla Kicks, MD  Admit date: 06/17/2012 Discharge date: 06/21/2012  Time spent: 45 minutes  Recommendations for Outpatient Follow-up:  Pulmonary follow up for severe COPD (untreated for many years) Tobacco cessation Establish care with PCP  Discharge Diagnoses:  Principal Problem:   Acute respiratory failure Active Problems:   COPD with acute exacerbation   NSTEMI (non-ST elevated myocardial infarction)   Accelerated hypertension   Pulmonary edema   Nicotine abuse   A-fib   Discharge Condition: Stable, in need of oxygen.  Oxygen sats drop to 84% without oxygen supplementation  Diet recommendation: heart healthy  Filed Weights   06/20/12 0600 06/20/12 2142 06/21/12 0500  Weight: 52.5 kg (115 lb 11.9 oz) 54.069 kg (119 lb 3.2 oz) 50.395 kg (111 lb 1.6 oz)    History of present illness at the time of admission:  Dominic Martinez is a 67 y.o. male with a past medical history that is unremarkable because the patient doesn't follow up with the any providers. He used to take medications for high blood pressure till about 15-20 years ago, but then he stopped. He also has a history of back injury about 20 years ago. Patient presents with complaints of worsening shortness of breath over the last 2-3 months. 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    He just couldn't do it and was getting more short of breath. So, he went to see a provider today, and he was referred to the emergency department. He has had a cough with thick, whitish expectoration.  Has had wheezing. He also has had left-sided chest pain 4 year to which comes and goes. It's relieved by rubbing that area. Not particularly associated with exertion. Continues to smoke cigarettes   Hospital Course:   Acute respiratory failure - newly diagnosed severe COPD  SOB for many years but untreated. Inpatient received  antibiotics, steroids and nebulizers.  Will be discharged on levaquin, prednisone taper, spiriva and albuterol inhaler - as well as home oxygen. He has improved significantly.  Follow up is being scheduled with Pulmonary and a PCP.  Pulmonary edema  Evident on chest xray.  Now diuresed and improved.  LV  EF appears to be preserved via nuclear meds studies and echo   Elevated cardiac enzymes  Noted to have mild bump in troponins which was likely secondary to stress/dyspnea/lung disease - Stress test showed no evidence of ischemia and an EF of 59%.   Accelerated hypertension  Blood pressure now appears controlled without medications.  Will follow up with PCP for on-going monitoring.  Nicotine abuse  Nicotine patch - patient counseled very clearly that he mustn't ever smoke again    Discharge Exam: Filed Vitals:   06/21/12 0900 06/21/12 0926 06/21/12 0928 06/21/12 0949  BP:      Pulse: 84     Temp:      TempSrc:      Resp:      Height:      Weight:      SpO2: 92% 80% 90% 90%   General:  Thin male, slightly pink, NAD, lying comfortably in bed with O2 via nasal canula HEENT:  Left eye slightly hyper-emic, tip of nose discolored and pale Lungs:  Good air movement, with coarse sounds in the bases.  No crackles or rales CV:  RRR no m/r/g Abdomen:  Soft, thin, nt, nd, +bs, no masses Extremities:  Able to move  all 4, ambulating well    Discharge Instructions      Discharge Orders   Future Orders Complete By Expires     Diet - low sodium heart healthy  As directed     Increase activity slowly  As directed         Medication List    TAKE these medications       acetaminophen 500 MG tablet  Commonly known as:  TYLENOL  Take 500-1,000 mg by mouth daily as needed for pain (Only used on occasion for pain).     albuterol 108 (90 BASE) MCG/ACT inhaler  Commonly known as:  PROVENTIL HFA;VENTOLIN HFA  Inhale 2 puffs into the lungs every 6 (six) hours as needed for wheezing.      aspirin 81 MG EC tablet  Take 1 tablet (81 mg total) by mouth daily.     atorvastatin 80 MG tablet  Commonly known as:  LIPITOR  Take 1 tablet (80 mg total) by mouth daily at 6 PM.     guaiFENesin 600 MG 12 hr tablet  Commonly known as:  MUCINEX  Take 1 tablet (600 mg total) by mouth 2 (two) times daily.     levofloxacin 250 MG tablet  Commonly known as:  LEVAQUIN  Take 1 tablet (250 mg total) by mouth daily.     nicotine 14 mg/24hr patch  Commonly known as:  NICODERM CQ - dosed in mg/24 hours  Place 1 patch onto the skin daily.     predniSONE 10 MG tablet  Commonly known as:  DELTASONE  Take 6 tablets (60 mg total) by mouth daily with breakfast. Taper:   Take 6 tabs on 4/30  Take 5 tabs on 5/1  Take 4 tabs on 5/2  Take 3 tabs on 5/3  Take 2 tabs on 5/4  Take 1 tab on 5/5 and stop     tiotropium 18 MCG inhalation capsule  Commonly known as:  SPIRIVA  Place 1 capsule (18 mcg total) into inhaler and inhale daily.       Follow-up Information   Follow up with HAWKINS,EDWARD L, MD On 06/30/2012. (3 pm, for pulmonary f/u and primary care physician visit. )    Contact information:   [address] [place] [address] [phone]        The results of significant diagnostics from this hospitalization (including imaging, microbiology, ancillary and laboratory) are listed below for reference.    Significant Diagnostic Studies: Dg Chest 2 View  06/20/2012  *RADIOLOGY REPORT*  Clinical Data: Shortness of breath.  Pleural effusions.  CHEST - 2 VIEW  Comparison: CT scan 06/17/2012.  Chest x-ray from 06/17/2012.  Findings: Lungs remain hyperexpanded. Interstitial markings are diffusely coarsened with chronic features.  Tiny bilateral pleural effusions are noted posteriorly. The cardiopericardial silhouette is within normal limits for size. Telemetry leads overlie the chest.  IMPRESSION: Hyperexpansion consistent with emphysema and tiny bilateral pleural  effusions.  Chronic interstitial coarsening.  No substantial interval change exam.   Original Report Authenticated By: Kennith Center, M.D.    Ct Chest Wo Contrast  06/17/2012  *RADIOLOGY REPORT*  Clinical Data: Hypoxia  CT CHEST WITHOUT CONTRAST  Technique:  Multidetector CT imaging of the chest was performed following the standard protocol without IV contrast.  Comparison: Chest radiograph same date  Findings: There is pulmonary hyperinflation with diffuse severe emphysematous change.  Small right and trace left pleural effusions are noted.  There are areas of patchy airspace opacity  predominately involving the right upper lobe but also in other lobes, with areas of bronchial wall thickening and interlobular septal thickening.  Heart size is normal.  Multiple prominent mediastinal and hilar lymph nodes are identified, representative pretracheal node 1.1 cm.  Mild atheromatous aortic calcification is present.  Pectus excavatum deformity is noted.  Degenerative changes are noted in the spine.  Age indeterminate T11 compression fracture noted with lower thoracic spine central endplate mild compression deformity.  IMPRESSION: Evidence of severe COPD with superimposed ground-glass airspace opacities and pleural effusions, most likely indicating superimposed edema.  Acute on chronic bronchitis and/or infectious/inflammatory airspace disease could have a similar appearance.   Original Report Authenticated By: Christiana Pellant, M.D.    Nm Myocar Multi W/spect W/wall Motion / Ef  06/20/2012  *RADIOLOGY REPORT*  Clinical Data:  Chest pain.  MYOCARDIAL IMAGING WITH SPECT (REST AND PHARMACOLOGIC-STRESS) GATED LEFT VENTRICULAR WALL MOTION STUDY LEFT VENTRICULAR EJECTION FRACTION  Technique:  Standard myocardial SPECT imaging was performed after resting intravenous injection of 10 mCi Tc-45m sestamibi. Subsequently, intravenous infusion of Lexiscan was performed under the supervision of the Cardiology staff.  At peak effect  of the drug, 30 mCi Tc-49m sestamibi was injected intravenously and standard myocardial SPECT  imaging was performed.  Quantitative gated imaging was also performed to evaluate left ventricular wall motion, and estimate left ventricular ejection fraction.  Comparison:  None  Findings: The SPECT stress and rest images demonstrate no fixed or reversible defects to suggest infarction or ischemia.  There is a large amount of splanchnic activity.  The gated SPECT images demonstrate normal wall motion and myocardial thickening with contraction.  The estimated ejection fraction is 59%.  IMPRESSION:  1.  Normal myocardial perfusion study.  No findings for ischemia or infarction. 2.  Ejection fraction calculated at 59%.   Original Report Authenticated By: Rudie Meyer, M.D.    Dg Chest Portable 1 View  06/17/2012  *RADIOLOGY REPORT*  Clinical Data: Shortness of breath.  Hypertension.  PORTABLE CHEST - 1 VIEW  Comparison: No priors.  Findings: There are coarse interstitial markings throughout the upper lobes of the lungs bilaterally, particularly in the apices. Coarse interstitial markings are also noted throughout the periphery of the lungs bilaterally, particularly in the lung bases where there are multiple Kerley B lines.  There does not appear to be cephalization of the pulmonary vasculature to suggest that these findings are related to pulmonary edema at this time. Additionally, heart size is normal.  The possibility of air space disease in the upper lobes of the lungs bilaterally and at the right base medially is not excluded.  No definite pleural effusions.  Atherosclerosis of the thoracic aorta.  IMPRESSION: 1.  Unusual appearance of the lung parenchyma, as above.  Although there is a possibility of acute multilobar pneumonia involving predominately the upper lobes of the lungs and the medial right base, findings have an appearance suggestive of a chronic process, potentially interstitial lung disease. Clinical  correlation is recommended, and further evaluation with high-resolution chest CT is suggested to better evaluate these findings. 2.  Atherosclerosis.   Original Report Authenticated By: Trudie Reed, M.D.     Microbiology: Recent Results (from the past 240 hour(s))  MRSA PCR SCREENING     Status: None   Collection Time    06/18/12 12:25 AM      Result Value Range Status   MRSA by PCR NEGATIVE  NEGATIVE Final   Comment:  The GeneXpert MRSA Assay (FDA     approved for NASAL specimens     only), is one component of a     comprehensive MRSA colonization     surveillance program. It is not     intended to diagnose MRSA     infection nor to guide or     monitor treatment for     MRSA infections.     Labs: Basic Metabolic Panel:  Recent Labs Lab 06/17/12 1607 06/18/12 0149 06/19/12 1000 06/20/12 0653 06/21/12 0450  NA 137 141 138 140 141  K 3.8 3.7 3.9 3.7 3.9  CL 97 97 94* 98 96  CO2 35* 34* 34* 38* 42*  GLUCOSE 114* 199* 277* 118* 119*  BUN 20 18 38* 44* 41*  CREATININE 0.83 0.80 1.28 1.42* 1.17  CALCIUM 8.8 9.0 8.8 8.6 8.7   Liver Function Tests:  Recent Labs Lab 06/18/12 0149  AST 16  ALT 16  ALKPHOS 96  BILITOT 0.5  PROT 6.9  ALBUMIN 3.1*   CBC:  Recent Labs Lab 06/17/12 1607 06/18/12 0911 06/19/12 0605 06/20/12 0653  WBC 8.4 8.3 20.9* 20.4*  NEUTROABS 6.5  --   --   --   HGB 15.2 15.2 15.0 15.3  HCT 45.7 45.2 44.8 46.6  MCV 100.2* 95.2 96.1 97.5  PLT 166 176 198 178   Cardiac Enzymes:  Recent Labs Lab 06/17/12 1607 06/17/12 1929 06/18/12 0149 06/18/12 0911 06/18/12 1316  TROPONINI 0.42* 0.33* 0.30* <0.30 <0.30   BNP: BNP (last 3 results)  Recent Labs  06/17/12 1607  PROBNP 1160.0*   Signed:  Jeoffrey Massed,  [facility] 06/21/2012, 12:12 PM  Attending Patient seen and examined, agree with the assessment and plan.  S Ghimire

## 2012-06-21 NOTE — Progress Notes (Signed)
Instructions given on spiriva -pt tol well

## 2012-06-21 NOTE — Progress Notes (Signed)
ABG result:  Ph 7.40 pO2 57.8 PCO2 62.8 Bicarb 38.2

## 2012-06-21 NOTE — Evaluation (Signed)
Occupational Therapy Evaluation Patient Details Name: Dominic Martinez MRN: [id number] DOB: July 25, 1945 Today's Date: 06/21/2012 Time: 4034-7425 OT Time Calculation (min): 17 min  OT Assessment / Plan / Recommendation Clinical Impression  This 67 yo male admitted with SOB, has not been to an MD in over 20 years, found to have COPD with acute exacerbation presents to acute OT with problems below. Will benefit from acute OT without need for follow up.    OT Assessment  Patient needs continued OT Services    Follow Up Recommendations  No OT follow up    Barriers to Discharge None          Frequency  Min 2X/week    Precautions / Restrictions Precautions Precautions: None Precaution Comments: Low O2 and high CO2 Restrictions Weight Bearing Restrictions: No   Pertinent Vitals/Pain O2 dropped to 80 on 1 liter with ambulating    ADL  Eating/Feeding: Simulated;Independent Where Assessed - Eating/Feeding: Edge of bed Grooming: Simulated;Supervision/safety Where Assessed - Grooming: Unsupported sitting Upper Body Bathing: Simulated;Supervision/safety Where Assessed - Upper Body Bathing: Unsupported sitting Lower Body Bathing: Simulated;Supervision/safety Where Assessed - Lower Body Bathing: Unsupported sit to stand Upper Body Dressing: Simulated;Supervision/safety Where Assessed - Upper Body Dressing: Unsupported sitting Lower Body Dressing: Simulated;Supervision/safety Where Assessed - Lower Body Dressing: Unsupported sit to stand Toilet Transfer: Buyer, retail Method: Sit to Barista:  (Bed>down hallway and back>recliner) Toileting - Architect and Hygiene: Supervision/safety Where Assessed - Engineer, mining and Hygiene: Standing Equipment Used:  (O2 initially at 1 liter>3 liters>left on 2 liters) Transfers/Ambulation Related to ADLs: S for all ADL Comments: Supervison only for purse lipped breathing  and safety with O2 tubing    OT Diagnosis: Generalized weakness  OT Problem List: Cardiopulmonary status limiting activity OT Treatment Interventions: Patient/family education;Energy conservation   OT Goals Acute Rehab OT Goals OT Goal Formulation: With patient Time For Goal Achievement: 06/28/12 Potential to Achieve Goals: Good Miscellaneous OT Goals Miscellaneous OT Goal #1: Pt will be aware of energy conservation techniques that would benefit him. OT Goal: Miscellaneous Goal #1 - Progress: Goal set today Miscellaneous OT Goal #2: Pt will be independent in managing his O2 tubing when moving about. OT Goal: Miscellaneous Goal #2 - Progress: Goal set today  Visit Information  Last OT Received On: 06/21/12 Assistance Needed: +1 PT/OT Co-Evaluation/Treatment: Yes    Subjective Data      Prior Functioning     Home Living Lives With: Spouse Available Help at Discharge: Family;Available 24 hours/day Type of Home: House Home Access: Stairs to enter [employer] of Steps: 3 Entrance Stairs-Rails: None Home Layout: Two level;Bed/bath upstairs Alternate Level Stairs-Number of Steps: 13 Alternate Level Stairs-Rails: Right Bathroom Shower/Tub: Tub/shower unit;Curtain Bathroom Toilet: Standard Bathroom Accessibility: No Home Adaptive Equipment: None Prior Function Level of Independence: Independent Able to Take Stairs?: Yes Driving: Yes Vocation: Part time employment Communication Communication: No difficulties Dominant Hand: Right         Vision/Perception Vision - History Baseline Vision: No visual deficits Patient Visual Report: No change from baseline   Cognition  Cognition Arousal/Alertness: Awake/alert Behavior During Therapy: WFL for tasks assessed/performed Overall Cognitive Status: Within Functional Limits for tasks assessed    Extremity/Trunk Assessment Right Upper Extremity Assessment RUE ROM/Strength/Tone: Within functional levels Left  Upper Extremity Assessment LUE ROM/Strength/Tone: WFL for tasks assessed Right Lower Extremity Assessment RLE ROM/Strength/Tone: Within functional levels Left Lower Extremity Assessment LLE ROM/Strength/Tone: Within functional levels     Mobility Bed Mobility Bed  Mobility: Supine to Sit;Sitting - Scoot to Edge of Bed Supine to Sit: 6: Modified independent (Device/Increase time);HOB elevated Sitting - Scoot to Edge of Bed: 7: Independent Transfers Transfers: Sit to Stand;Stand to Sit Sit to Stand: 7: Independent;From bed Stand to Sit: 7: Independent;To chair/3-in-1        Balance Balance Balance Assessed: No   End of Session OT - End of Session Equipment Utilized During Treatment:  (O2) Activity Tolerance:  (limited by drop in O2 sats with activity) Patient left: in chair;with call bell/phone within reach Nurse Communication:  (PT to tell RN about O2 sats)       Evette Georges  [phone]  06/21/2012, 10:50 AM

## 2012-06-21 NOTE — Progress Notes (Addendum)
Physical Therapy Treatment Patient Details Name: Dominic Martinez MRN: [id number] DOB: Apr 18, 1945 Today's Date: 06/21/2012 Time: 9147-8295 PT Time Calculation (min): 14 min  PT Assessment / Plan / Recommendation Comments on Treatment Session       Follow Up Recommendations  Outpatient PT (for cardio-pulmonary trainning. )     Does the patient have the potential to tolerate intense rehabilitation     Barriers to Discharge        Equipment Recommendations  None recommended by PT    Recommendations for Other Services    Frequency     Plan      Precautions / Restrictions Precautions Precautions: None Precaution Comments: Low O2 and high CO2 Restrictions Weight Bearing Restrictions: No   Pertinent Vitals/Pain No c/o pain SATURATION QUALIFICATIONS: (This note is used to comply with regulatory documentation for home oxygen)  Patient Saturations on 1L O2 at Rest = 92%  Patient Saturations on 1L O2 while Ambulating = 80%  Patient Saturations on 3Liters of oxygen while Ambulating = 90%  Please briefly explain why patient needs home oxygen:    Mobility  Bed Mobility Bed Mobility: Supine to Sit;Sitting - Scoot to Edge of Bed Supine to Sit: 6: Modified independent (Device/Increase time);HOB elevated Sitting - Scoot to Edge of Bed: 7: Independent Transfers Transfers: Sit to Stand;Stand to Sit Sit to Stand: 7: Independent;From bed Stand to Sit: 7: Independent;To chair/3-in-1 Ambulation/Gait Ambulation/Gait Assistance: 7: Independent Ambulation Distance (Feet): 200 Feet Assistive device: None Ambulation/Gait Assistance Details: SpO2 dropped to 80 while ambulating on 1 liter O2 via [place].  Gait Pattern: Within Functional Limits Stairs: Yes Stairs Assistance: 7: Independent Stair Management Technique: No rails Number of Stairs: 3 Wheelchair Mobility Wheelchair Mobility: No    Exercises     PT Diagnosis: Other (comment)  PT Problem List: Decreased activity tolerance PT  Treatment Interventions:     PT Goals    Visit Information  Last PT Received On: 06/21/12 Assistance Needed: +1 PT/OT Co-Evaluation/Treatment: Yes    Subjective Data  Subjective: Agree to PT/OT eva Patient Stated Goal: return to home.Dominic Martinez  Cognition Arousal/Alertness: Awake/alert Behavior During Therapy: WFL for tasks assessed/performed Overall Cognitive Status: Within Functional Limits for tasks assessed    Balance  Balance Balance Assessed: No  End of Session PT - End of Session Equipment Utilized During Treatment: Gait belt;Oxygen Activity Tolerance: Patient tolerated treatment well Patient left: in chair;with call bell/phone within reach Nurse Communication: Mobility status;Other (comment) (O2 response to activity)   GP Functional Assessment Tool Used: Clinical Judgement Functional Limitation: Mobility: Walking and moving around Mobility: Walking and Moving Around Current Status [phone]): 0 percent impaired, limited or restricted Mobility: Walking and Moving Around Goal Status [phone]): 0 percent impaired, limited or restricted Mobility: Walking and Moving Around Discharge Status [phone]): 0 percent impaired, limited or restricted   Dominic Martinez 06/21/2012, 10:47 AM Theron Arista L. Severn Goddard DPT [phone].

## 2012-06-21 NOTE — Progress Notes (Signed)
Pt has an abnormal CO2 OF 42. RN paged MD on call and is awaiting further orders.

## 2012-06-21 NOTE — Progress Notes (Signed)
Dominic Martinez to be D/C'd Home per MD order.  Discussed with the patient and all questions fully answered.    Medication List    TAKE these medications       acetaminophen 500 MG tablet  Commonly known as:  TYLENOL  Take 500-1,000 mg by mouth daily as needed for pain (Only used on occasion for pain).     albuterol 108 (90 BASE) MCG/ACT inhaler  Commonly known as:  PROVENTIL HFA;VENTOLIN HFA  Inhale 2 puffs into the lungs every 6 (six) hours as needed for wheezing.     aspirin 81 MG EC tablet  Take 1 tablet (81 mg total) by mouth daily.     atorvastatin 80 MG tablet  Commonly known as:  LIPITOR  Take 1 tablet (80 mg total) by mouth daily at 6 PM.     guaiFENesin 600 MG 12 hr tablet  Commonly known as:  MUCINEX  Take 1 tablet (600 mg total) by mouth 2 (two) times daily.     levofloxacin 250 MG tablet  Commonly known as:  LEVAQUIN  Take 1 tablet (250 mg total) by mouth daily.     nicotine 14 mg/24hr patch  Commonly known as:  NICODERM CQ - dosed in mg/24 hours  Place 1 patch onto the skin daily.     predniSONE 10 MG tablet  Commonly known as:  DELTASONE  Take 6 tablets (60 mg total) by mouth daily with breakfast. Taper:   Take 6 tabs on 4/30  Take 5 tabs on 5/1  Take 4 tabs on 5/2  Take 3 tabs on 5/3  Take 2 tabs on 5/4  Take 1 tab on 5/5 and stop     tiotropium 18 MCG inhalation capsule  Commonly known as:  SPIRIVA  Place 1 capsule (18 mcg total) into inhaler and inhale daily.        VVS, Skin clean, dry and intact without evidence of skin break down, no evidence of skin tears noted. IV catheter discontinued intact. Site without signs and symptoms of complications. Dressing and pressure applied.  An After Visit Summary was printed and given to the patient. Patient escorted via WC, and D/C home via private auto.  Kennyth Arnold D 06/21/2012 11:05 AM

## 2012-06-21 NOTE — Progress Notes (Signed)
SATURATION QUALIFICATIONS: (This note is used to comply with regulatory documentation for home oxygen)  Patient Saturations on Room Air at Rest = 92%  Patient Saturations on Room Air while Ambulating = 84%  Patient Saturations on 2 Liters of oxygen while Ambulating = 95%  Please briefly explain why patient needs home oxygen: While slowly ambulating on room air, his O2 sats maintained around 83-84%. On 2 L O2 ambulating, his O2 sats were 95%.

## 2012-06-22 NOTE — Care Management Note (Signed)
    Page 1 of 1   06/22/2012     11:44:17 AM   CARE MANAGEMENT NOTE 06/22/2012  Patient:  Dominic Martinez, Dominic Martinez   Account Number:  [id number]  Date Initiated:  06/22/2012  Documentation initiated by:  Letha Cape  Subjective/Objective Assessment:   dx acute resp failure  admit- lives with spouse.  pta indep.     Action/Plan:   cardiac rehab outpt   Anticipated DC Date:  06/21/2012   Anticipated DC Plan:  HOME/SELF CARE      DC Planning Services  CM consult  Follow-up appt scheduled      Choice offered to / List presented to:             Status of service:  Completed, signed off Medicare Important Message given?   (If response is "NO", the following Medicare IM given date fields will be blank) Date Medicare IM given:   Date Additional Medicare IM given:    Discharge Disposition:  HOME/SELF CARE  Per UR Regulation:  Reviewed for med. necessity/level of care/duration of stay  If discussed at Long Length of Stay Meetings, dates discussed:    Comments:  06/22/12 11:41 Letha Cape RN, BSN [phone] patient lives with spouse, pta indep. Scheduled apt with Kari Baars for f/u and PCP.

## 2014-10-23 NOTE — Patient Outreach (Signed)
[facility]) Care Management  10/23/2014  Dominic Martinez 1945/10/17 [id number]   Referral from [place] [facility], assigned Burgess Amor, RN to outreach.  Thanks, [name]. [place], [name] Phone: [phone] Fax: [phone]

## 2014-10-24 ENCOUNTER — Other Ambulatory Visit: Payer: Self-pay | Admitting: *Deleted

## 2014-10-24 NOTE — Patient Outreach (Signed)
Call to patient for [facility] program screening. Patient states that he no longer see a [facility] doctor, he now is seeing a doctor in [place], he is unsure how to spell the name but states it is "Dr. Kinnie Scales" He is happy with new doctor as he has helped him with his breathing. Patient reports he takes 5 medications for breathing issues and is on 2lpm of oxygen "99% of the time" RNCM discussed that [facility] program works with [facility] providers and that if he was to come back to a [facility] provider he would be eligible for services again. He verbalized understanding. Plan to close out screening as patient not longer eligible as he is not seeing a [facility] provider. Royetta Crochet. Laymond Purser, RN, BSN, [employer] [phone]

## 2014-10-30 NOTE — Patient Outreach (Signed)
[facility]) Care Management  10/30/2014  Dominic Martinez 1946-01-16 [id number]   Notification from Burgess Amor, RN to close case due to patient no longer has a [place] [facility] provider, patient now sees a physician in [place], [place].  Thanks, [name]. [place], [place] Assistant Phone: [phone] Fax: [phone]

## 2015-01-24 DIAGNOSIS — J90 Pleural effusion, not elsewhere classified: Secondary | ICD-10-CM

## 2015-01-24 HISTORY — DX: Pleural effusion, not elsewhere classified: J90

## 2015-02-07 ENCOUNTER — Inpatient Hospital Stay (HOSPITAL_COMMUNITY)
Admission: EM | Admit: 2015-02-07 | Discharge: 2015-02-18 | DRG: 190 | Disposition: A | Discharge status: Home / Self Care | Payer: Medicare Other | Attending: Internal Medicine | Admitting: Internal Medicine

## 2015-02-07 ENCOUNTER — Encounter (HOSPITAL_COMMUNITY): Payer: Self-pay | Admitting: Cardiology

## 2015-02-07 ENCOUNTER — Emergency Department (HOSPITAL_COMMUNITY): Payer: Medicare Other

## 2015-02-07 DIAGNOSIS — Z809 Family history of malignant neoplasm, unspecified: Secondary | ICD-10-CM

## 2015-02-07 DIAGNOSIS — E872 Acidosis: Secondary | ICD-10-CM | POA: Diagnosis present

## 2015-02-07 DIAGNOSIS — I10 Essential (primary) hypertension: Secondary | ICD-10-CM | POA: Diagnosis present

## 2015-02-07 DIAGNOSIS — J440 Chronic obstructive pulmonary disease with acute lower respiratory infection: Principal | ICD-10-CM | POA: Diagnosis present

## 2015-02-07 DIAGNOSIS — Z9889 Other specified postprocedural states: Secondary | ICD-10-CM

## 2015-02-07 DIAGNOSIS — I214 Non-ST elevation (NSTEMI) myocardial infarction: Secondary | ICD-10-CM

## 2015-02-07 DIAGNOSIS — Z8249 Family history of ischemic heart disease and other diseases of the circulatory system: Secondary | ICD-10-CM

## 2015-02-07 DIAGNOSIS — E860 Dehydration: Secondary | ICD-10-CM | POA: Diagnosis present

## 2015-02-07 DIAGNOSIS — Z23 Encounter for immunization: Secondary | ICD-10-CM

## 2015-02-07 DIAGNOSIS — J90 Pleural effusion, not elsewhere classified: Secondary | ICD-10-CM | POA: Diagnosis present

## 2015-02-07 DIAGNOSIS — D696 Thrombocytopenia, unspecified: Secondary | ICD-10-CM | POA: Diagnosis present

## 2015-02-07 DIAGNOSIS — J810 Acute pulmonary edema: Secondary | ICD-10-CM

## 2015-02-07 DIAGNOSIS — J9621 Acute and chronic respiratory failure with hypoxia: Secondary | ICD-10-CM | POA: Diagnosis present

## 2015-02-07 DIAGNOSIS — J189 Pneumonia, unspecified organism: Secondary | ICD-10-CM | POA: Diagnosis present

## 2015-02-07 DIAGNOSIS — E870 Hyperosmolality and hypernatremia: Secondary | ICD-10-CM | POA: Diagnosis present

## 2015-02-07 DIAGNOSIS — J441 Chronic obstructive pulmonary disease with (acute) exacerbation: Secondary | ICD-10-CM | POA: Diagnosis present

## 2015-02-07 DIAGNOSIS — J9622 Acute and chronic respiratory failure with hypercapnia: Secondary | ICD-10-CM | POA: Diagnosis present

## 2015-02-07 DIAGNOSIS — Z9981 Dependence on supplemental oxygen: Secondary | ICD-10-CM

## 2015-02-07 DIAGNOSIS — Z8701 Personal history of pneumonia (recurrent): Secondary | ICD-10-CM | POA: Diagnosis not present

## 2015-02-07 DIAGNOSIS — F17200 Nicotine dependence, unspecified, uncomplicated: Secondary | ICD-10-CM | POA: Diagnosis present

## 2015-02-07 DIAGNOSIS — R0602 Shortness of breath: Secondary | ICD-10-CM | POA: Diagnosis present

## 2015-02-07 DIAGNOSIS — J9601 Acute respiratory failure with hypoxia: Secondary | ICD-10-CM

## 2015-02-07 DIAGNOSIS — J948 Other specified pleural conditions: Secondary | ICD-10-CM | POA: Diagnosis not present

## 2015-02-07 HISTORY — DX: Chronic obstructive pulmonary disease, unspecified: J44.9

## 2015-02-07 LAB — CBC WITH DIFFERENTIAL/PLATELET
BASOS PCT: 0 %
Basophils Absolute: 0 10*3/uL (ref 0.0–0.1)
Eosinophils Absolute: 0 10*3/uL (ref 0.0–0.7)
Eosinophils Relative: 0 %
HEMATOCRIT: 46.7 % (ref 39.0–52.0)
HEMOGLOBIN: 13.6 g/dL (ref 13.0–17.0)
LYMPHS ABS: 0.6 10*3/uL — AB (ref 0.7–4.0)
LYMPHS PCT: 7 %
MCH: 33 pg (ref 26.0–34.0)
MCHC: 29.1 g/dL — AB (ref 30.0–36.0)
MCV: 113.3 fL — ABNORMAL HIGH (ref 78.0–100.0)
MONOS PCT: 7 %
Monocytes Absolute: 0.6 10*3/uL (ref 0.1–1.0)
NEUTROS ABS: 7.4 10*3/uL (ref 1.7–7.7)
NEUTROS PCT: 86 %
Platelets: 142 10*3/uL — ABNORMAL LOW (ref 150–400)
RBC: 4.12 MIL/uL — ABNORMAL LOW (ref 4.22–5.81)
RDW: 13.9 % (ref 11.5–15.5)
WBC: 8.6 10*3/uL (ref 4.0–10.5)

## 2015-02-07 LAB — COMPREHENSIVE METABOLIC PANEL
ALBUMIN: 3.6 g/dL (ref 3.5–5.0)
ALK PHOS: 70 U/L (ref 38–126)
ALT: 17 U/L (ref 17–63)
AST: 13 U/L — ABNORMAL LOW (ref 15–41)
BILIRUBIN TOTAL: 0.4 mg/dL (ref 0.3–1.2)
BUN: 25 mg/dL — AB (ref 6–20)
CALCIUM: 9.2 mg/dL (ref 8.9–10.3)
CO2: >50 mmol/L — ABNORMAL HIGH (ref 22–32)
Chloride: 92 mmol/L — ABNORMAL LOW (ref 101–111)
Creatinine, Ser: 0.52 mg/dL — ABNORMAL LOW (ref 0.61–1.24)
GFR calc Af Amer: >60 mL/min (ref >60)
GFR calc non Af Amer: >60 mL/min (ref >60)
GLUCOSE: 135 mg/dL — AB (ref 65–99)
Potassium: 4.4 mmol/L (ref 3.5–5.1)
Sodium: 147 mmol/L — ABNORMAL HIGH (ref 135–145)
TOTAL PROTEIN: 6.7 g/dL (ref 6.5–8.1)

## 2015-02-07 LAB — BLOOD GAS, ARTERIAL
ACID-BASE EXCESS: 20.9 mmol/L — AB (ref 0.0–2.0)
ACID-BASE EXCESS: 17.8 mmol/L — AB (ref 0.0–2.0)
Acid-Base Excess: 20.4 mmol/L — ABNORMAL HIGH (ref 0.0–2.0)
BICARBONATE: 40 meq/L — AB (ref 20.0–24.0)
BICARBONATE: 38 meq/L — AB (ref 20.0–24.0)
Bicarbonate: 40.2 mEq/L — ABNORMAL HIGH (ref 20.0–24.0)
DELIVERY SYSTEMS: POSITIVE
DELIVERY SYSTEMS: POSITIVE
DRAWN BY: 23534
Drawn by: 23534
Drawn by: 382351
EXPIRATORY PAP: 6
Expiratory PAP: 5
FIO2: 0.6
FIO2: 0.55
INSPIRATORY PAP: 18
Inspiratory PAP: 16
Mode: POSITIVE
O2 CONTENT: 60 L/min
O2 Content: 4 L/min
O2 SAT: 71 %
O2 Saturation: 95.9 %
O2 Saturation: 87.6 %
PATIENT TEMPERATURE: 37
PCO2 ART: 117 mmHg — AB (ref 35.0–45.0)
PH ART: 7.238 — AB (ref 7.350–7.450)
PO2 ART: 42.4 mmHg — AB (ref 80.0–100.0)
PO2 ART: 60.1 mmHg — AB (ref 80.0–100.0)
RATE: 10 resp/min
pCO2 arterial: 124 mmHg (ref 35.0–45.0)
pCO2 arterial: 111 mmHg (ref 35.0–45.0)
pH, Arterial: 7.245 — ABNORMAL LOW (ref 7.350–7.450)
pH, Arterial: 7.218 — ABNORMAL LOW (ref 7.350–7.450)
pO2, Arterial: 93.5 mmHg (ref 80.0–100.0)

## 2015-02-07 LAB — BRAIN NATRIURETIC PEPTIDE: B Natriuretic Peptide: 129 pg/mL — ABNORMAL HIGH (ref 0.0–100.0)

## 2015-02-07 LAB — TROPONIN I: Troponin I: <0.03 ng/mL (ref <0.031)

## 2015-02-07 MED ORDER — ALBUTEROL SULFATE (2.5 MG/3ML) 0.083% IN NEBU: 2.5000 mg | INHALATION_SOLUTION | RESPIRATORY_TRACT | Status: DC | PRN

## 2015-02-07 MED ORDER — ASPIRIN EC 81 MG PO TBEC
81.0000 mg | DELAYED_RELEASE_TABLET | Freq: Every day | ORAL | Status: DC
  Administered 2015-02-08 – 2015-02-18 (×11): 81 mg via ORAL
  Filled 2015-02-07 (×11): qty 1

## 2015-02-07 MED ORDER — SODIUM CHLORIDE 0.9 % IV BOLUS (SEPSIS)
1000.0000 mL | Freq: Once | INTRAVENOUS | Status: AC
  Administered 2015-02-07: 1000 mL via INTRAVENOUS

## 2015-02-07 MED ORDER — METHYLPREDNISOLONE SODIUM SUCC 125 MG IJ SOLR
60.0000 mg | Freq: Four times a day (QID) | INTRAMUSCULAR | Status: DC
  Administered 2015-02-08 – 2015-02-15 (×31): 60 mg via INTRAVENOUS
  Filled 2015-02-07 (×31): qty 2

## 2015-02-07 MED ORDER — DEXTROSE 5 % IV SOLN
500.0000 mg | INTRAVENOUS | Status: DC
  Administered 2015-02-08 – 2015-02-10 (×3): 500 mg via INTRAVENOUS
  Filled 2015-02-07 (×3): qty 500

## 2015-02-07 MED ORDER — ALBUTEROL SULFATE (2.5 MG/3ML) 0.083% IN NEBU: 2.5000 mg | INHALATION_SOLUTION | Freq: Four times a day (QID) | RESPIRATORY_TRACT | Status: DC

## 2015-02-07 MED ORDER — MAGNESIUM SULFATE 2 GM/50ML IV SOLN
2.0000 g | Freq: Once | INTRAVENOUS | Status: AC
  Administered 2015-02-07: 2 g via INTRAVENOUS
  Filled 2015-02-07: qty 50

## 2015-02-07 MED ORDER — ONDANSETRON HCL 4 MG PO TABS: 4.0000 mg | ORAL_TABLET | Freq: Four times a day (QID) | ORAL | Status: DC | PRN

## 2015-02-07 MED ORDER — DEXTROSE 5 % IV SOLN
500.0000 mg | Freq: Once | INTRAVENOUS | Status: AC
  Administered 2015-02-07: 500 mg via INTRAVENOUS
  Filled 2015-02-07: qty 500

## 2015-02-07 MED ORDER — ENOXAPARIN SODIUM 40 MG/0.4ML ~~LOC~~ SOLN
40.0000 mg | SUBCUTANEOUS | Status: DC
  Administered 2015-02-08 – 2015-02-14 (×7): 40 mg via SUBCUTANEOUS
  Filled 2015-02-07 (×7): qty 0.4

## 2015-02-07 MED ORDER — ONDANSETRON HCL 4 MG/2ML IJ SOLN: 4.0000 mg | Freq: Four times a day (QID) | INTRAMUSCULAR | Status: DC | PRN

## 2015-02-07 MED ORDER — IPRATROPIUM BROMIDE 0.02 % IN SOLN
0.5000 mg | Freq: Four times a day (QID) | RESPIRATORY_TRACT | Status: DC
Start: 2015-02-08 — End: 2015-02-07

## 2015-02-07 MED ORDER — SODIUM CHLORIDE 0.45 % IV SOLN
INTRAVENOUS | Status: DC
  Administered 2015-02-08 – 2015-02-12 (×5): via INTRAVENOUS
  Administered 2015-02-12: 75 mL/h via INTRAVENOUS

## 2015-02-07 MED ORDER — DEXTROSE 5 % IV SOLN
1.0000 g | Freq: Once | INTRAVENOUS | Status: AC
  Administered 2015-02-07: 1 g via INTRAVENOUS
  Filled 2015-02-07: qty 10

## 2015-02-07 MED ORDER — DEXTROSE 5 % IV SOLN
1.0000 g | INTRAVENOUS | Status: DC
  Administered 2015-02-08 – 2015-02-10 (×3): 1 g via INTRAVENOUS
  Filled 2015-02-07 (×3): qty 10

## 2015-02-07 MED ORDER — ATORVASTATIN CALCIUM 40 MG PO TABS
80.0000 mg | ORAL_TABLET | Freq: Every day | ORAL | Status: DC
  Administered 2015-02-08 – 2015-02-18 (×11): 80 mg via ORAL
  Filled 2015-02-07 (×11): qty 2

## 2015-02-07 MED ORDER — GUAIFENESIN ER 600 MG PO TB12
600.0000 mg | ORAL_TABLET | Freq: Two times a day (BID) | ORAL | Status: DC
  Administered 2015-02-08: 600 mg via ORAL
  Filled 2015-02-07: qty 1

## 2015-02-07 MED ORDER — IPRATROPIUM-ALBUTEROL 0.5-2.5 (3) MG/3ML IN SOLN
3.0000 mL | Freq: Four times a day (QID) | RESPIRATORY_TRACT | Status: DC
  Administered 2015-02-08 – 2015-02-10 (×12): 3 mL via RESPIRATORY_TRACT
  Filled 2015-02-07 (×12): qty 3

## 2015-02-07 MED ORDER — ACETAMINOPHEN 500 MG PO TABS: 500.0000 mg | ORAL_TABLET | Freq: Four times a day (QID) | ORAL | Status: DC | PRN

## 2015-02-07 MED ORDER — IPRATROPIUM BROMIDE 0.02 % IN SOLN
0.5000 mg | Freq: Once | RESPIRATORY_TRACT | Status: AC
  Administered 2015-02-07: 0.5 mg via RESPIRATORY_TRACT
  Filled 2015-02-07: qty 2.5

## 2015-02-07 MED ORDER — ALBUTEROL (5 MG/ML) CONTINUOUS INHALATION SOLN
10.0000 mg/h | INHALATION_SOLUTION | RESPIRATORY_TRACT | Status: DC
  Administered 2015-02-07: 10 mg/h via RESPIRATORY_TRACT
  Filled 2015-02-07: qty 20

## 2015-02-07 NOTE — ED Notes (Signed)
Critical ABG results received from Resp Therapist and given to Dr Dayna Barker at this time

## 2015-02-07 NOTE — ED Notes (Signed)
[facility] called out for SOB.  Receiving second neb.  Also gave 125 mg solumedrol.  Diminished breath sounds.  Initial sat 98% on non rebreather.  80 CO2.

## 2015-02-07 NOTE — H&P (Addendum)
PCP:   No primary care provider on file.   Chief Complaint:  Shortness of breath  HPI:  69 year old male who  has a past medical history of Pneumonia; Gangrene ([place]); Back injury (1994); and COPD (chronic obstructive pulmonary disease) ([place]). Today came to the hospital with worsening shortness of breath for past 2 weeks. Tonight the breathing became worse so he came to the hospital. He also complains of cough but no chest pain. He denies nausea vomiting or diarrhea. Denies coughing up any phlegm. In the ED patient is currently on BiPAP, initial ABG showed pH 7.245 with PCO2 117. Repeat ABG at 2049 showed pH 7.238, PCO2 111, PO2 60.1. Patient is currently comfortable on BiPAP and slowly improving. He denies fever, no dysuria urgency or frequency of urination. Chest x-ray shows scattered interstitial infiltrates, edema versus infection.  Allergies:  No Known Allergies    Past Medical History  Diagnosis Date  . Pneumonia   . Gangrene ([place])   . Back injury 1994  . COPD (chronic obstructive pulmonary disease) [facility])     Past Surgical History  Procedure Laterality Date  . Appendectomy    . Abdominal surgery      Prior to Admission medications   Medication Sig Start Date End Date Taking? Authorizing Provider  acetaminophen (TYLENOL) 500 MG tablet Take 500-1,000 mg by mouth daily as needed for pain (Only used on occasion for pain).    Historical Provider, MD  ADVAIR DISKUS 250-50 MCG/DOSE AEPB INHALE 1 PUFF BY MOUTH TWICE A DAY RINSE MOUTH AFTER USE 01/26/15   Historical Provider, MD  albuterol (PROVENTIL HFA;VENTOLIN HFA) 108 (90 BASE) MCG/ACT inhaler Inhale 2 puffs into the lungs every 6 (six) hours as needed for wheezing. 06/21/12   Melton Alar, PA-C  aspirin EC 81 MG EC tablet Take 1 tablet (81 mg total) by mouth daily. 06/21/12   Bobby Rumpf York, PA-C  atorvastatin (LIPITOR) 80 MG tablet Take 1 tablet (80 mg total) by mouth daily at 6 PM. 06/21/12   Bobby Rumpf York, PA-C    guaiFENesin (MUCINEX) 600 MG 12 hr tablet Take 1 tablet (600 mg total) by mouth 2 (two) times daily. 06/21/12   Bobby Rumpf York, PA-C  levofloxacin (LEVAQUIN) 250 MG tablet Take 1 tablet (250 mg total) by mouth daily. 06/21/12   Bobby Rumpf York, PA-C  nicotine (NICODERM CQ - DOSED IN MG/24 HOURS) 14 mg/24hr patch Place 1 patch onto the skin daily. 06/21/12   Bobby Rumpf York, PA-C  predniSONE (DELTASONE) 10 MG tablet Take 6 tablets (60 mg total) by mouth daily with breakfast. Taper:  Take 6 tabs on 4/30 Take 5 tabs on 5/1 Take 4 tabs on 5/2 Take 3 tabs on 5/3 Take 2 tabs on 5/4 Take 1 tab on 5/5 and stop 06/21/12   Melton Alar, PA-C  theophylline (UNIPHYL) 400 MG 24 hr tablet TAKE 1/2 TABLET BY MOUTH TWICE DAILY AS DIRECTED 01/09/15   Historical Provider, MD  tiotropium (SPIRIVA) 18 MCG inhalation capsule Place 1 capsule (18 mcg total) into inhaler and inhale daily. 06/21/12   Melton Alar, PA-C    Social History:  reports that he has been smoking.  He does not have any smokeless tobacco history on file. He reports that he does not drink alcohol or use illicit drugs.  Family History  Problem Relation Age of Onset  . Heart attack Brother   . Coronary artery disease Brother   . Cancer Brother  Filed Weights   02/07/15 1621  Weight: 49.896 kg (110 lb)    All the positives are listed in BOLD  Review of Systems:  HEENT: Headache, blurred vision, runny nose, sore throat Neck: Hypothyroidism, hyperthyroidism,,lymphadenopathy Chest : Shortness of breath, history of COPD, Asthma Heart : Chest pain, history of coronary arterey disease GI:  Nausea, vomiting, diarrhea, constipation, GERD GU: Dysuria, urgency, frequency of urination, hematuria Neuro: Stroke, seizures, syncope Psych: Depression, anxiety, hallucinations   Physical Exam: Blood pressure 125/75, pulse 84, temperature 97.6 F (36.4 C), temperature source Oral, resp. rate 20, height 5\' 9"  (1.753 m), weight 49.896 kg  (110 lb), SpO2 98 %. Constitutional:   Patient is a well-developed and well-nourished male* in no acute distress and cooperative with exam. Head: Normocephalic and atraumatic Mouth: Mucus membranes moist Eyes: PERRL, EOMI, conjunctivae normal Neck: Supple, No Thyromegaly Cardiovascular: RRR, S1 normal, S2 normal Pulmonary/Chest: bilateral rhonchi  Abdominal: Soft. Non-tender, non-distended, bowel sounds are normal, no masses, organomegaly, or guarding present.  Neurological: A&O x3, Strength is normal and symmetric bilaterally, cranial nerve II-XII are grossly intact, no focal motor deficit, sensory intact to light touch bilaterally.  Extremities : No Cyanosis, Clubbing or Edema  Labs on Admission:  Basic Metabolic Panel:  Recent Labs Lab 02/07/15 1634  NA 147*  K 4.4  CL 92*  CO2 >50*  GLUCOSE 135*  BUN 25*  CREATININE 0.52*  CALCIUM 9.2   Liver Function Tests:  Recent Labs Lab 02/07/15 1634  AST 13*  ALT 17  ALKPHOS 70  BILITOT 0.4  PROT 6.7  ALBUMIN 3.6    CBC:  Recent Labs Lab 02/07/15 1634  WBC 8.6  NEUTROABS 7.4  HGB 13.6  HCT 46.7  MCV 113.3*  PLT 142*   Cardiac Enzymes:  Recent Labs Lab 02/07/15 [place] <0.03    Radiological Exams on Admission: Dg Chest Portable 1 View  02/07/2015  CLINICAL DATA:  Shortness of breath, diminished breath sounds, COPD EXAM: PORTABLE CHEST 1 VIEW COMPARISON:  Portable exam 1637 hours compared to 06/20/2012 FINDINGS: Borderline enlargement of cardiac silhouette. Stable mediastinal contours with atherosclerotic calcification at arch. Emphysematous changes with diffuse interstitial prominence versus previous exam suspicious for superimposed interstitial infiltrates, question edema versus infection particularly in upper lobes. No pleural effusion or pneumothorax. Bones unremarkable. IMPRESSION: COPD changes with scattered diffuse interstitial prominence questionable interstitial infiltrates ; this could represent  superimposed edema or infection. Electronically Signed   By: Lavonia Dana M.D.   On: 02/07/2015 16:50    EKG: Independently reviewed.  Normal sinus rhythm  Assessment/Plan Active Problems:   COPD exacerbation (HCC)  Acute respiratory failure, hypercapnic We'll admit the patient to stepdown unit, continue BiPAP.  Patient is a full code so might require intubation if he doesn't improve. We'll start Solu-Medrol 60 minutes grams IV every 6 hours, DuoNeb nebulizer every 6 hours, albuterol nebulizer every 2 hours when necessary. Mucinex 1 tablet by mouth twice a day.  ? Pneumonia  Chest x-ray shows possible pneumonia so Rocephin and through max has been started.  Will check blood culture, strep pneumo urinary antigen and Legionella antigen.   Hypernatremia Sodium is 147, will start half-normal saline at 75 ML per hour Follow sodium in a.m.  DVT prophylaxis Lovenox  Code status: Full code  Family discussion: No family present at bedside   Time Spent on Admission: 60 minutes  [facility] Pager: [phone] 02/07/2015, 9:35 PM  If 7PM-7AM, please contact night-coverage  [email or web address]  Password [email or web address]

## 2015-02-07 NOTE — ED Notes (Signed)
CRITICAL VALUE ALERT  Critical value received:  ABG PH 7.21, pCO2 124, PO2 93.5, Bicarb 40.2, SO2 95.9  Date of notification:  02/07/15  Time of notification:  1857  Critical value read back:Yes.    Nurse who received alert:  RMinter, RN  MD notified (1st page):  Dr. Dayna Barker  Time of first page:  1857  MD notified (2nd page):  Time of second page:  Responding MD:  Dr. Dayna Barker  Time MD responded:  [phone]

## 2015-02-07 NOTE — ED Provider Notes (Signed)
CSN: [id number]     Arrival date & time 02/07/15  1616 History   First MD Initiated Contact with Patient 02/07/15 1618     Chief Complaint  Patient presents with  . Respiratory Distress     (Consider location/radiation/quality/duration/timing/severity/associated sxs/prior Treatment) Patient is a 69 y.o. male presenting with shortness of breath.  Shortness of Breath Severity:  Moderate Onset quality:  Gradual Duration:  2 weeks Timing:  Constant Progression:  Worsening Chronicity:  Recurrent Context: not activity, not animal exposure and not emotional upset   Relieved by:  None tried Worsened by:  Nothing tried Ineffective treatments:  None tried Associated symptoms: cough   Associated symptoms: no chest pain     Past Medical History  Diagnosis Date  . Pneumonia   . Gangrene ([place])   . Back injury 1994  . COPD (chronic obstructive pulmonary disease) [facility])    Past Surgical History  Procedure Laterality Date  . Appendectomy    . Abdominal surgery     Family History  Problem Relation Age of Onset  . Heart attack Brother   . Coronary artery disease Brother   . Cancer Brother    Social History  Substance Use Topics  . Smoking status: Current Every Day Smoker -- 0.50 packs/day for 50 years  . Smokeless tobacco: None  . Alcohol Use: No    Review of Systems  Unable to perform ROS: Acuity of condition  Respiratory: Positive for cough and shortness of breath.   Cardiovascular: Negative for chest pain.      Allergies  Review of patient's allergies indicates no known allergies.  Home Medications   Prior to Admission medications   Medication Sig Start Date End Date Taking? Authorizing Provider  ADVAIR DISKUS 250-50 MCG/DOSE AEPB INHALE 1 PUFF BY MOUTH TWICE A DAY RINSE MOUTH AFTER USE 01/26/15  Yes Historical Provider, MD  albuterol (PROVENTIL HFA;VENTOLIN HFA) 108 (90 BASE) MCG/ACT inhaler Inhale 2 puffs into the lungs every 6 (six) hours as needed for wheezing.  06/21/12  Yes Marianne L York, PA-C  amLODipine (NORVASC) 5 MG tablet Take 5 mg by mouth daily.   Yes Historical Provider, MD  guaiFENesin (MUCINEX) 600 MG 12 hr tablet Take 1 tablet (600 mg total) by mouth 2 (two) times daily. Patient taking differently: Take 600 mg by mouth 2 (two) times daily as needed for cough or to loosen phlegm.  06/21/12  Yes Marianne L York, PA-C  ipratropium-albuterol (DUONEB) 0.5-2.5 (3) MG/3ML SOLN Take 3 mLs by nebulization every 6 (six) hours as needed.   Yes Historical Provider, MD  theophylline (UNIPHYL) 400 MG 24 hr tablet TAKE 1/2 TABLET BY MOUTH TWICE DAILY AS DIRECTED 01/09/15  Yes Historical Provider, MD  tiotropium (SPIRIVA) 18 MCG inhalation capsule Place 1 capsule (18 mcg total) into inhaler and inhale daily. 06/21/12  Yes Bobby Rumpf York, PA-C  acetaminophen (TYLENOL) 500 MG tablet Take 500-1,000 mg by mouth daily as needed for pain (Only used on occasion for pain).    Historical Provider, MD   BP 145/76 mmHg  Pulse 79  Temp(Src) 96.8 F (36 C) (Axillary)  Resp 23  Ht 5\' 8"  (1.727 m)  Wt 99 lb 3.3 oz (45 kg)  BMI 15.09 kg/m2  SpO2 93% Physical Exam  Constitutional: He is oriented to person, place, and time. He appears well-developed. He appears cachectic. He appears distressed.  HENT:  Head: Normocephalic and atraumatic.  Eyes: Conjunctivae are normal. Pupils are equal, round, and reactive to light.  Neck:  Normal range of motion.  Cardiovascular: Normal rate and regular rhythm.   Pulmonary/Chest: He is in respiratory distress. He has wheezes.  Abdominal: Soft. He exhibits no distension.  Musculoskeletal: Normal range of motion. He exhibits no edema.  Neurological: He is alert and oriented to person, place, and time. No cranial nerve deficit. Coordination normal.  Skin: Skin is warm and dry. No rash noted. No erythema.  Nursing note and vitals reviewed.   ED Course  Procedures (including critical care time)  CRITICAL CARE Performed by:  Merrily Pew  Total critical care time: 55 minutes Critical care time was exclusive of separately billable procedures and treating other patients. Critical care was necessary to treat or prevent imminent or life-threatening deterioration. Critical care was time spent personally by me on the following activities: development of treatment plan with patient and/or surrogate as well as nursing, discussions with consultants, evaluation of patient's response to treatment, examination of patient, obtaining history from patient or surrogate, ordering and performing treatments and interventions, ordering and review of laboratory studies, ordering and review of radiographic studies, pulse oximetry and re-evaluation of patient's condition.   Labs Review Labs Reviewed  CBC WITH DIFFERENTIAL/PLATELET - Abnormal; Notable for the following:    RBC 4.12 (*)    MCV 113.3 (*)    MCHC 29.1 (*)    Platelets 142 (*)    Lymphs Abs 0.6 (*)    All other components within normal limits  COMPREHENSIVE METABOLIC PANEL - Abnormal; Notable for the following:    Sodium 147 (*)    Chloride 92 (*)    CO2 >50 (*)    Glucose, Bld 135 (*)    BUN 25 (*)    Creatinine, Ser 0.52 (*)    AST 13 (*)    All other components within normal limits  BLOOD GAS, ARTERIAL - Abnormal; Notable for the following:    pH, Arterial 7.245 (*)    pCO2 arterial 117 (*)    pO2, Arterial 42.4 (*)    Bicarbonate 40.0 (*)    Acid-Base Excess 20.9 (*)    All other components within normal limits  BLOOD GAS, ARTERIAL - Abnormal; Notable for the following:    pH, Arterial 7.218 (*)    pCO2 arterial 124 (*)    Bicarbonate 40.2 (*)    Acid-Base Excess 20.4 (*)    All other components within normal limits  BLOOD GAS, ARTERIAL - Abnormal; Notable for the following:    pH, Arterial 7.238 (*)    pCO2 arterial 111 (*)    pO2, Arterial 60.1 (*)    Bicarbonate 38.0 (*)    Acid-Base Excess 17.8 (*)    All other components within normal limits   BRAIN NATRIURETIC PEPTIDE - Abnormal; Notable for the following:    B Natriuretic Peptide 129.0 (*)    All other components within normal limits  CULTURE, BLOOD (ROUTINE X 2)  CULTURE, BLOOD (ROUTINE X 2)  MRSA PCR SCREENING  TROPONIN I  STREP PNEUMONIAE URINARY ANTIGEN  LEGIONELLA ANTIGEN, URINE  CBC  COMPREHENSIVE METABOLIC PANEL    Imaging Review Dg Chest Portable 1 View  02/07/2015  CLINICAL DATA:  Shortness of breath, diminished breath sounds, COPD EXAM: PORTABLE CHEST 1 VIEW COMPARISON:  Portable exam 1637 hours compared to 06/20/2012 FINDINGS: Borderline enlargement of cardiac silhouette. Stable mediastinal contours with atherosclerotic calcification at arch. Emphysematous changes with diffuse interstitial prominence versus previous exam suspicious for superimposed interstitial infiltrates, question edema versus infection particularly in upper lobes. No pleural  effusion or pneumothorax. Bones unremarkable. IMPRESSION: COPD changes with scattered diffuse interstitial prominence questionable interstitial infiltrates ; this could represent superimposed edema or infection. Electronically Signed   By: Lavonia Dana M.D.   On: 02/07/2015 16:50   I have personally reviewed and evaluated these images and lab results as part of my medical decision-making.   EKG Interpretation   Date/Time:  Thursday February 07 2015 16:25:31 EST Ventricular Rate:  89 PR Interval:  140 QRS Duration: 84 QT Interval:  360 QTC Calculation: 438 R Axis:   71 Text Interpretation:  Sinus rhythm Probable left atrial enlargement  Nonspecific T abnrm, anterolateral leads Baseline wander in lead(s) V3  Technically poor tracing Confirmed by [facility] MD, Corene Cornea [phone]) on  02/07/2015 5:12:34 PM      MDM   Final diagnoses:  Hypernatremia  Acute respiratory failure with hypoxia (HCC)  COPD exacerbation (HCC)  NSTEMI (non-ST elevated myocardial infarction) ([place])  Acute pulmonary edema (HCC)   Respiratory  distress, not moving much air.  Initial differential diagnosis includes pneumonia, CHF exacerbation, COPD exacerbation, pulmonary embolus or ACS. Will start with duo nebs, mag, fluids, abx, bipap and reevaluate. Will also check baseline ECG and abg. H/o heart disease, but doesn't seem like fluid overload at this moment.   on reevaluation patient's mental status is improved with BiPAP. However repeat ABG was slightly worse. Respiratory says  The mask wasn't fitted appropriately and mask readjusted and repeat ABG drawn which showed mild improvement over initial ABG. She has mental status still appropriate. Is no longer as hypoxic. Appears much better than on arrival. No need for intubation this time. Feel like COPD exacerbations most of the cause of symptoms of this time. I discussed with the hospitalist who agreed with admitting the patient to the ICU for further management.    Merrily Pew, MD 02/08/15 [phone]

## 2015-02-07 NOTE — ED Notes (Signed)
Received Critical ABG results from j Stophel -- PH 7.23  C02 111 PO2 60.1  Bicarb 38  Dr Mesner informed at this time

## 2015-02-08 DIAGNOSIS — I10 Essential (primary) hypertension: Secondary | ICD-10-CM | POA: Diagnosis present

## 2015-02-08 DIAGNOSIS — D696 Thrombocytopenia, unspecified: Secondary | ICD-10-CM

## 2015-02-08 DIAGNOSIS — E870 Hyperosmolality and hypernatremia: Secondary | ICD-10-CM

## 2015-02-08 DIAGNOSIS — J9622 Acute and chronic respiratory failure with hypercapnia: Secondary | ICD-10-CM

## 2015-02-08 DIAGNOSIS — J189 Pneumonia, unspecified organism: Secondary | ICD-10-CM

## 2015-02-08 DIAGNOSIS — J9621 Acute and chronic respiratory failure with hypoxia: Secondary | ICD-10-CM

## 2015-02-08 LAB — BLOOD GAS, ARTERIAL
ACID-BASE EXCESS: 18.3 mmol/L — AB (ref 0.0–2.0)
BICARBONATE: 39.4 meq/L — AB (ref 20.0–24.0)
DRAWN BY: 277331
O2 Content: 4 L/min
O2 Saturation: 82.9 %
PATIENT TEMPERATURE: 37
pCO2 arterial: 84.2 mmHg (ref 35.0–45.0)
pH, Arterial: 7.345 — ABNORMAL LOW (ref 7.350–7.450)
pO2, Arterial: 50.9 mmHg — ABNORMAL LOW (ref 80.0–100.0)

## 2015-02-08 LAB — CBC
HEMATOCRIT: 40 % (ref 39.0–52.0)
Hemoglobin: 11.8 g/dL — ABNORMAL LOW (ref 13.0–17.0)
MCH: 33 pg (ref 26.0–34.0)
MCHC: 29.5 g/dL — AB (ref 30.0–36.0)
MCV: 111.7 fL — ABNORMAL HIGH (ref 78.0–100.0)
PLATELETS: 115 10*3/uL — AB (ref 150–400)
RBC: 3.58 MIL/uL — ABNORMAL LOW (ref 4.22–5.81)
RDW: 13.5 % (ref 11.5–15.5)
WBC: 11 10*3/uL — AB (ref 4.0–10.5)

## 2015-02-08 LAB — COMPREHENSIVE METABOLIC PANEL
ALBUMIN: 3 g/dL — AB (ref 3.5–5.0)
ALT: 11 U/L — ABNORMAL LOW (ref 17–63)
ANION GAP: 3 — AB (ref 5–15)
AST: 10 U/L — ABNORMAL LOW (ref 15–41)
Alkaline Phosphatase: 56 U/L (ref 38–126)
BILIRUBIN TOTAL: 0.5 mg/dL (ref 0.3–1.2)
BUN: 23 mg/dL — ABNORMAL HIGH (ref 6–20)
CO2: 46 mmol/L — ABNORMAL HIGH (ref 22–32)
Calcium: 8.8 mg/dL — ABNORMAL LOW (ref 8.9–10.3)
Chloride: 95 mmol/L — ABNORMAL LOW (ref 101–111)
Creatinine, Ser: 0.4 mg/dL — ABNORMAL LOW (ref 0.61–1.24)
GFR calc Af Amer: >60 mL/min (ref >60)
GLUCOSE: 132 mg/dL — AB (ref 65–99)
POTASSIUM: 4.4 mmol/L (ref 3.5–5.1)
SODIUM: 144 mmol/L (ref 135–145)
TOTAL PROTEIN: 5.8 g/dL — AB (ref 6.5–8.1)

## 2015-02-08 LAB — MRSA PCR SCREENING: MRSA BY PCR: NEGATIVE

## 2015-02-08 LAB — STREP PNEUMONIAE URINARY ANTIGEN: STREP PNEUMO URINARY ANTIGEN: NEGATIVE

## 2015-02-08 MED ORDER — MOMETASONE FURO-FORMOTEROL FUM 100-5 MCG/ACT IN AERO
2.0000 | INHALATION_SPRAY | Freq: Two times a day (BID) | RESPIRATORY_TRACT | Status: DC
  Administered 2015-02-08 – 2015-02-13 (×11): 2 via RESPIRATORY_TRACT
  Filled 2015-02-08: qty 8.8

## 2015-02-08 MED ORDER — THEOPHYLLINE ER 200 MG PO TB12
200.0000 mg | ORAL_TABLET | Freq: Two times a day (BID) | ORAL | Status: DC
  Administered 2015-02-08 – 2015-02-13 (×11): 200 mg via ORAL
  Filled 2015-02-08 (×17): qty 1

## 2015-02-08 MED ORDER — THEOPHYLLINE ER 400 MG PO TB24
200.0000 mg | ORAL_TABLET | Freq: Two times a day (BID) | ORAL | Status: DC
  Administered 2015-02-08: 200 mg via ORAL
  Filled 2015-02-08 (×7): qty 0.5

## 2015-02-08 MED ORDER — PNEUMOCOCCAL VAC POLYVALENT 25 MCG/0.5ML IJ INJ
0.5000 mL | INJECTION | INTRAMUSCULAR | Status: AC
  Administered 2015-02-09: 0.5 mL via INTRAMUSCULAR
  Filled 2015-02-08: qty 0.5

## 2015-02-08 MED ORDER — AMLODIPINE BESYLATE 5 MG PO TABS
5.0000 mg | ORAL_TABLET | Freq: Every day | ORAL | Status: DC
  Administered 2015-02-08 – 2015-02-18 (×11): 5 mg via ORAL
  Filled 2015-02-08 (×11): qty 1

## 2015-02-08 MED ORDER — GUAIFENESIN ER 600 MG PO TB12
1200.0000 mg | ORAL_TABLET | Freq: Two times a day (BID) | ORAL | Status: DC
  Administered 2015-02-08 – 2015-02-18 (×21): 1200 mg via ORAL
  Filled 2015-02-08 (×21): qty 2

## 2015-02-08 NOTE — Clinical Documentation Improvement (Addendum)
Hospitalist  (Query response must be documented in the progress notes and discharge summary, not on the BPA itself.  BPA's are not part of the permanent medical record.)  Please clarify if the following diagnoses, documented by the ED provider, are applicable to this admission or not:   - NSTEMI Troponin <0.03 on admission, no repeat values so far.   - Acute Pulmonary Edema   patient has not received any IV or po Lasix so far this admission   EF 59% by nuclear stress test 06/20/12  CXR this admission: COPD changes with scattered diffuse interstitial prominence questionable interstitial infiltrates ; this could representsuperimposed edema or infection   Please exercise your independent, professional judgment when responding. A specific answer is not anticipated or expected.   Thank You,  Erling Conte  RN BSN CCDS [phone] Health Information Management [place]

## 2015-02-08 NOTE — Care Management Note (Signed)
Case Management Note  Patient Details  Name: Dominic Martinez MRN: [id number] Date of Birth: Jan 11, 1946  Subjective/Objective:                  Pt admitted from home with COPD exacerbation. Pt lives with family and will return home at discharge. Pt is independent with ADL's. Pt has home O2 with [facility] and neb machine.  Action/Plan: No CM needs noted. Pt refuses any home health services at this time.  Expected Discharge Date:                  Expected Discharge Plan:  Home/Self Care  In-House Referral:  NA  Discharge planning Services  CM Consult  Post Acute Care Choice:  NA Choice offered to:  NA  DME Arranged:    DME Agency:     HH Arranged:    HH Agency:     Status of Service:  Completed, signed off  Medicare Important Message Given:    Date Medicare IM Given:    Medicare IM give by:    Date Additional Medicare IM Given:    Additional Medicare Important Message give by:     If discussed at [place] of Stay Meetings, dates discussed:    Additional Comments:  Joylene Draft, RN 02/08/2015, 3:05 PM

## 2015-02-08 NOTE — Progress Notes (Signed)
[facility] PROGRESS NOTE  Dominic Martinez [id number] DOB: 06/28/1945 DOA: 02/07/2015 PCP: No primary care provider on file.  Assessment/Plan: 1. COPD exacerbation, improving with IV abx, IV steroids, and bronchodilators. Will continue pulmonary hygeine.  2. Acute on chronic respiratory failure with hypoxia and hypercapnia. Patient reports that he chronically uses 2L O2. In ER he was noted to be tachypneic and required BIPAP therapy. He was found to have severe hypercapnia and associated respiratory acidosis. He was started on BIPAP with improvement in his breathing. He is currently on 4L [place]. Will repeat ABG today to follow up on PCO2 level and PH. Wean O2 to baseline 2L as tolerated.   3. CAP. CXR revealed possible infiltrate. Continue empiric abx. WBC 11, afebrile. BC are pending. 4. Hypernatremia, likely related to dehydration. Resolved with hypertonic fluids.  5. Thrombocytopenia, unclear etiology. Possibly related to PNA. Will continue to monitor.  6. Essential HTN, stable. Continue home meds.  7. Tobacco abuse, counseled on the importance of cessation.    Code Status: Full DVT prophylaxis: Lovenox Family Communication: Discussed with patient who understands and has no concerns at this time. Disposition Plan:   discharge when improved.    Consultants:    Procedures:    Antibiotics:  Zithromax 12/15>>  Rocephin 12/15>>  HPI/Subjective: Feels fine, breathing is doing better. Denies a cough.   Objective: Filed Vitals:   02/08/15 0500 02/08/15 0600  BP: 148/78 139/80  Pulse: 80 77  Temp:    Resp: 18     Intake/Output Summary (Last 24 hours) at 02/08/15 0720 Last data filed at 02/08/15 0500  Gross per 24 hour  Intake  192.5 ml  Output    200 ml  Net   -7.5 ml   Filed Weights   02/07/15 1621 02/07/15 2306 02/08/15 0500  Weight: 49.896 kg (110 lb) 45 kg (99 lb 3.3 oz) 46.4 kg (102 lb 4.7 oz)    Exam:  General: NAD, looks comfortable Cardiovascular:  RRR, S1, S2  Respiratory: Diminished breath sounds, No wheezing  Abdomen: soft, non tender, no distention , bowel sounds normal Musculoskeletal: No edema b/l   Data Reviewed: Basic Metabolic Panel:  Recent Labs Lab 02/07/15 1634 02/08/15 0416  NA 147* 144  K 4.4 4.4  CL 92* 95*  CO2 >50* 46*  GLUCOSE 135* 132*  BUN 25* 23*  CREATININE 0.52* 0.40*  CALCIUM 9.2 8.8*   Liver Function Tests:  Recent Labs Lab 02/07/15 1634 02/08/15 0416  AST 13* 10*  ALT 17 11*  ALKPHOS 70 56  BILITOT 0.4 0.5  PROT 6.7 5.8*  ALBUMIN 3.6 3.0*   CBC:  Recent Labs Lab 02/07/15 1634 02/08/15 0416  WBC 8.6 11.0*  NEUTROABS 7.4  --   HGB 13.6 11.8*  HCT 46.7 40.0  MCV 113.3* 111.7*  PLT 142* 115*   Cardiac Enzymes:  Recent Labs Lab 02/07/15 1634  TROPONINI <0.03   BNP (last 3 results)  Recent Labs  02/07/15 1634  BNP 129.0*      Recent Results (from the past 240 hour(s))  MRSA PCR Screening     Status: None   Collection Time: 02/07/15 11:14 AM  Result Value Ref Range Status   MRSA by PCR NEGATIVE NEGATIVE Final    Comment:        The GeneXpert MRSA Assay (FDA approved for NASAL specimens only), is one component of a comprehensive MRSA colonization surveillance program. It is not intended to diagnose MRSA infection nor to guide or monitor treatment  for MRSA infections.   Culture, blood (Routine X 2) w Reflex to ID Panel     Status: None (Preliminary result)   Collection Time: 02/07/15 10:10 PM  Result Value Ref Range Status   Specimen Description LEFT ANTECUBITAL  Final   Special Requests BOTTLES DRAWN AEROBIC AND ANAEROBIC 6CC  Final   Culture PENDING  Incomplete   Report Status PENDING  Incomplete  Culture, blood (Routine X 2) w Reflex to ID Panel     Status: None (Preliminary result)   Collection Time: 02/07/15 10:15 PM  Result Value Ref Range Status   Specimen Description BLOOD RIGHT ARM  Final   Special Requests BOTTLES DRAWN AEROBIC ONLY [place]  Final    Culture PENDING  Incomplete   Report Status PENDING  Incomplete     Studies: Dg Chest Portable 1 View  02/07/2015  CLINICAL DATA:  Shortness of breath, diminished breath sounds, COPD EXAM: PORTABLE CHEST 1 VIEW COMPARISON:  Portable exam 1637 hours compared to 06/20/2012 FINDINGS: Borderline enlargement of cardiac silhouette. Stable mediastinal contours with atherosclerotic calcification at arch. Emphysematous changes with diffuse interstitial prominence versus previous exam suspicious for superimposed interstitial infiltrates, question edema versus infection particularly in upper lobes. No pleural effusion or pneumothorax. Bones unremarkable. IMPRESSION: COPD changes with scattered diffuse interstitial prominence questionable interstitial infiltrates ; this could represent superimposed edema or infection. Electronically Signed   By: Lavonia Dana M.D.   On: 02/07/2015 16:50    Scheduled Meds: . aspirin EC  81 mg Oral Daily  . atorvastatin  80 mg Oral q1800  . azithromycin  500 mg Intravenous Q24H  . cefTRIAXone (ROCEPHIN)  IV  1 g Intravenous Q24H  . enoxaparin (LOVENOX) injection  40 mg Subcutaneous Q24H  . guaiFENesin  600 mg Oral BID  . ipratropium-albuterol  3 mL Nebulization Q6H  . methylPREDNISolone (SOLU-MEDROL) injection  60 mg Intravenous Q6H  . [START ON 02/09/2015] pneumococcal 23 valent vaccine  0.5 mL Intramuscular Tomorrow-1000   Continuous Infusions: . sodium chloride 75 mL/hr at 02/08/15 0300    Active Problems:   COPD exacerbation (HCC)   Hypernatremia    Time spent: 20 minutes   Jehanzeb Memon. MD  [facility] Pager [phone]. If 7PM-7AM, please contact night-coverage at [email or web address], password [facility] 02/08/2015, 7:20 AM  LOS: 1 day     By signing my name below, I, Rosalie Doctor, attest that this documentation has been prepared under the direction and in the presence of [facility]. MD Electronically Signed: Rosalie Doctor, Scribe.  02/08/2015 9:31am  I, Dr. Kathie Dike, personally performed the services described in this documentaiton. All medical record entries made by the scribe were at my direction and in my presence. I have reviewed the chart and agree that the record reflects my personal performance and is accurate and complete  Kathie Dike, MD, 02/08/2015 9:45 AM

## 2015-02-08 NOTE — Progress Notes (Signed)
**Note De-Identified Kiaja Shorty Obfuscation** Patient removed from  BIPAP and placed on 6L [place]; tolerating well, SAT 96%, BBS dim... RRT to continue to monitor.

## 2015-02-09 DIAGNOSIS — J440 Chronic obstructive pulmonary disease with acute lower respiratory infection: Secondary | ICD-10-CM | POA: Diagnosis not present

## 2015-02-09 LAB — BLOOD GAS, ARTERIAL
Acid-Base Excess: 18.7 mmol/L — ABNORMAL HIGH (ref 0.0–2.0)
BICARBONATE: 40.4 meq/L — AB (ref 20.0–24.0)
Drawn by: 22223
O2 Content: 5 L/min
O2 Saturation: 78.5 %
PCO2 ART: 74.2 mmHg — AB (ref 35.0–45.0)
PH ART: 7.398 (ref 7.350–7.450)
PO2 ART: 43.6 mmHg — AB (ref 80.0–100.0)
Patient temperature: 37
TCO2: 12.5 mmol/L (ref 0–100)

## 2015-02-09 LAB — BASIC METABOLIC PANEL
Anion gap: 4 — ABNORMAL LOW (ref 5–15)
BUN: 27 mg/dL — AB (ref 6–20)
CHLORIDE: 91 mmol/L — AB (ref 101–111)
CO2: 45 mmol/L — ABNORMAL HIGH (ref 22–32)
CREATININE: 0.6 mg/dL — AB (ref 0.61–1.24)
Calcium: 8.6 mg/dL — ABNORMAL LOW (ref 8.9–10.3)
GFR calc Af Amer: >60 mL/min (ref >60)
GFR calc non Af Amer: >60 mL/min (ref >60)
Glucose, Bld: 156 mg/dL — ABNORMAL HIGH (ref 65–99)
Potassium: 4.9 mmol/L (ref 3.5–5.1)
SODIUM: 140 mmol/L (ref 135–145)

## 2015-02-09 LAB — CBC
HCT: 39.2 % (ref 39.0–52.0)
Hemoglobin: 11.9 g/dL — ABNORMAL LOW (ref 13.0–17.0)
MCH: 32.8 pg (ref 26.0–34.0)
MCHC: 30.4 g/dL (ref 30.0–36.0)
MCV: 108 fL — AB (ref 78.0–100.0)
PLATELETS: 145 10*3/uL — AB (ref 150–400)
RBC: 3.63 MIL/uL — ABNORMAL LOW (ref 4.22–5.81)
RDW: 13.8 % (ref 11.5–15.5)
WBC: 15.4 10*3/uL — AB (ref 4.0–10.5)

## 2015-02-09 NOTE — Progress Notes (Signed)
[facility] PROGRESS NOTE  Dominic Martinez [id number] DOB: 19-Mar-1945 DOA: 02/07/2015 PCP: No primary care provider on file.  Assessment/Plan: 1. COPD exacerbation, improving with IV abx, IV steroids, and bronchodilators. Will continue pulmonary hygeine. 2. Acute on chronic respiratory failure with hypoxia and hypercapnia. Patient reports that he chronically uses 2L O2. In ER he was noted to be tachypneic and required BIPAP therapy. He was found to have severe hypercapnia and associated respiratory acidosis. His breathing improved with the BIPAP so he was transitioned to [place]. Attempted to wean his [place] down to 4-5L on 12/16 however his sats dropped into the 80s. He will remain on high flow [place] for now and wean as tolerated. Will consult pulmonology.   3. CAP. CXR revealed possible infiltrate. Continue empiric abx. WBC 15.4 (on steroids), afebrile. BC are pending. 4. Hypernatremia, likely related to dehydration. Resolved with hypertonic fluids.  5. Thrombocytopenia, unclear etiology. Possibly related to PNA. Will continue to monitor.  6. Essential HTN, stable. Continue home meds.  7. Tobacco abuse, counseled on the importance of cessation.    Code Status: Full DVT prophylaxis: Lovenox Family Communication: Discussed with patient who understands and has no concerns at this time. Disposition Plan:   discharge when improved.    Consultants:    Procedures:    Antibiotics:  Zithromax 12/15>>  Rocephin 12/15>>  HPI/Subjective: Breathing is improved today. Still has a productive cough. Denies any CP.   Objective: Filed Vitals:   02/09/15 0600 02/09/15 0700  BP: 134/76 137/84  Pulse: 80 80  Temp:    Resp: 24 16    Intake/Output Summary (Last 24 hours) at 02/09/15 0731 Last data filed at 02/09/15 0600  Gross per 24 hour  Intake 3108.5 ml  Output    875 ml  Net 2233.5 ml   Filed Weights   02/07/15 2306 02/08/15 0500 02/09/15 0509  Weight: 45 kg (99 lb 3.3 oz) 46.4 kg  (102 lb 4.7 oz) 47.1 kg (103 lb 13.4 oz)    Exam:  General: NAD, looks comfortable Cardiovascular: RRR, S1, S2  Respiratory: Coarse breath sounds at bases, No wheezing  Abdomen: soft, non tender, no distention , bowel sounds normal Musculoskeletal: No edema b/l   Data Reviewed: Basic Metabolic Panel:  Recent Labs Lab 02/07/15 1634 02/08/15 0416 02/09/15 0429  NA 147* 144 140  K 4.4 4.4 4.9  CL 92* 95* 91*  CO2 >50* 46* 45*  GLUCOSE 135* 132* 156*  BUN 25* 23* 27*  CREATININE 0.52* 0.40* 0.60*  CALCIUM 9.2 8.8* 8.6*   Liver Function Tests:  Recent Labs Lab 02/07/15 1634 02/08/15 0416  AST 13* 10*  ALT 17 11*  ALKPHOS 70 56  BILITOT 0.4 0.5  PROT 6.7 5.8*  ALBUMIN 3.6 3.0*   CBC:  Recent Labs Lab 02/07/15 1634 02/08/15 0416 02/09/15 0429  WBC 8.6 11.0* 15.4*  NEUTROABS 7.4  --   --   HGB 13.6 11.8* 11.9*  HCT 46.7 40.0 39.2  MCV 113.3* 111.7* 108.0*  PLT 142* 115* 145*   Cardiac Enzymes:  Recent Labs Lab 02/07/15 1634  TROPONINI <0.03   BNP (last 3 results)  Recent Labs  02/07/15 1634  BNP 129.0*      Recent Results (from the past 240 hour(s))  MRSA PCR Screening     Status: None   Collection Time: 02/07/15 11:14 AM  Result Value Ref Range Status   MRSA by PCR NEGATIVE NEGATIVE Final    Comment:  The GeneXpert MRSA Assay (FDA approved for NASAL specimens only), is one component of a comprehensive MRSA colonization surveillance program. It is not intended to diagnose MRSA infection nor to guide or monitor treatment for MRSA infections.   Culture, blood (Routine X 2) w Reflex to ID Panel     Status: None (Preliminary result)   Collection Time: 02/07/15 10:10 PM  Result Value Ref Range Status   Specimen Description LEFT ANTECUBITAL  Final   Special Requests BOTTLES DRAWN AEROBIC AND ANAEROBIC 6CC  Final   Culture NO GROWTH 2 DAYS  Final   Report Status PENDING  Incomplete  Culture, blood (Routine X 2) w Reflex to ID  Panel     Status: None (Preliminary result)   Collection Time: 02/07/15 10:15 PM  Result Value Ref Range Status   Specimen Description BLOOD RIGHT ARM  Final   Special Requests BOTTLES DRAWN AEROBIC ONLY 6CC  Final   Culture NO GROWTH 2 DAYS  Final   Report Status PENDING  Incomplete     Studies: Dg Chest Portable 1 View  02/07/2015  CLINICAL DATA:  Shortness of breath, diminished breath sounds, COPD EXAM: PORTABLE CHEST 1 VIEW COMPARISON:  Portable exam 1637 hours compared to 06/20/2012 FINDINGS: Borderline enlargement of cardiac silhouette. Stable mediastinal contours with atherosclerotic calcification at arch. Emphysematous changes with diffuse interstitial prominence versus previous exam suspicious for superimposed interstitial infiltrates, question edema versus infection particularly in upper lobes. No pleural effusion or pneumothorax. Bones unremarkable. IMPRESSION: COPD changes with scattered diffuse interstitial prominence questionable interstitial infiltrates ; this could represent superimposed edema or infection. Electronically Signed   By: Lavonia Dana M.D.   On: 02/07/2015 16:50    Scheduled Meds: . amLODipine  5 mg Oral Daily  . aspirin EC  81 mg Oral Daily  . atorvastatin  80 mg Oral q1800  . azithromycin  500 mg Intravenous Q24H  . cefTRIAXone (ROCEPHIN)  IV  1 g Intravenous Q24H  . enoxaparin (LOVENOX) injection  40 mg Subcutaneous Q24H  . guaiFENesin  1,200 mg Oral BID  . ipratropium-albuterol  3 mL Nebulization Q6H  . methylPREDNISolone (SOLU-MEDROL) injection  60 mg Intravenous Q6H  . mometasone-formoterol  2 puff Inhalation BID  . pneumococcal 23 valent vaccine  0.5 mL Intramuscular Tomorrow-1000  . theophylline  200 mg Oral BID   Continuous Infusions: . sodium chloride 75 mL/hr at 02/09/15 0701    Active Problems:   COPD exacerbation (HCC)   Hypernatremia   Acute on chronic respiratory failure with hypoxia and hypercapnia (HCC)   CAP (community acquired  pneumonia)   Thrombocytopenia ([place])   HTN (hypertension)    Time spent: 20 minutes   Jehanzeb Memon. MD  [facility] Pager [phone]. If 7PM-7AM, please contact night-coverage at [email or web address], password [facility] 02/09/2015, 7:31 AM  LOS: 2 days     By signing my name below, I, Dominic Martinez, attest that this documentation has been prepared under the direction and in the presence of [facility]. MD Electronically Signed: Rosalie Martinez, Scribe. 02/09/2015 10:25am  I, Dr. Kathie Dike, personally performed the services described in this documentaiton. All medical record entries made by the scribe were at my direction and in my presence. I have reviewed the chart and agree that the record reflects my personal performance and is accurate and complete  Kathie Dike, MD, 02/09/2015 10:48 AM

## 2015-02-10 LAB — BASIC METABOLIC PANEL
ANION GAP: 2 — AB (ref 5–15)
BUN: 29 mg/dL — ABNORMAL HIGH (ref 6–20)
CALCIUM: 8.6 mg/dL — AB (ref 8.9–10.3)
CHLORIDE: 93 mmol/L — AB (ref 101–111)
CO2: 43 mmol/L — AB (ref 22–32)
Creatinine, Ser: 0.58 mg/dL — ABNORMAL LOW (ref 0.61–1.24)
GFR calc Af Amer: >60 mL/min (ref >60)
GFR calc non Af Amer: >60 mL/min (ref >60)
GLUCOSE: 154 mg/dL — AB (ref 65–99)
Potassium: 5 mmol/L (ref 3.5–5.1)
Sodium: 138 mmol/L (ref 135–145)

## 2015-02-10 LAB — MAGNESIUM: Magnesium: 1.7 mg/dL (ref 1.7–2.4)

## 2015-02-10 NOTE — Progress Notes (Signed)
[facility] PROGRESS NOTE  Dominic Martinez [id number] DOB: 07-24-1945 DOA: 02/07/2015 PCP: No primary care provider on file.  Assessment/Plan: 1. COPD exacerbation, improving with IV abx, IV steroids, and bronchodilators. Will continue pulmonary hygeine. 2. Acute on chronic respiratory failure with hypoxia and hypercapnia. Patient reports that he chronically uses 2L O2. In ER he was noted to be tachypneic and required BIPAP therapy. He was found to have severe hypercapnia and associated respiratory acidosis. His breathing improved with the BIPAP so he was transitioned to [place]. Currently on high flow [place] at 10L, will wean to baseline 2L as tolerated. Pulmonology consulted.  3. CAP. CXR revealed possible infiltrate. Continue empiric abx. Afebrile. BC are pending. Likely transition to oral abx tomorrow. 4. Hypernatremia, likely related to dehydration. Resolved with hypotonic fluids.  5. Thrombocytopenia, unclear etiology. Possibly related to PNA. Will continue to monitor.  6. Essential HTN, stable. Continue home meds.  7. Tobacco abuse, counseled on the importance of cessation.    Code Status: Full DVT prophylaxis: Lovenox Family Communication: Discussed with patient who understands and has no concerns at this time. Disposition Plan:   discharge when improved.    Consultants:    Procedures:    Antibiotics:  Zithromax 12/15>>  Rocephin 12/15>>  HPI/Subjective: Feels good. States his breathing is better. Denies coughing or CP.   Objective: Filed Vitals:   02/10/15 0700 02/10/15 0730  BP: 142/78   Pulse: 82   Temp:  97 F (36.1 C)  Resp: 22     Intake/Output Summary (Last 24 hours) at 02/10/15 0736 Last data filed at 02/10/15 0600  Gross per 24 hour  Intake   2700 ml  Output   1075 ml  Net   1625 ml   Filed Weights   02/08/15 0500 02/09/15 0509 02/10/15 0400  Weight: 46.4 kg (102 lb 4.7 oz) 47.1 kg (103 lb 13.4 oz) 49.6 kg (109 lb 5.6 oz)    Exam:  General:  NAD, looks comfortable Cardiovascular: RRR, S1, S2  Respiratory: Coarse breath sounds at the left base, No wheezing  Abdomen: soft, non tender, no distention , bowel sounds normal Musculoskeletal: No edema b/l   Data Reviewed: Basic Metabolic Panel:  Recent Labs Lab 02/07/15 1634 02/08/15 0416 02/09/15 0429 02/10/15 0458  NA 147* 144 140 138  K 4.4 4.4 4.9 5.0  CL 92* 95* 91* 93*  CO2 >50* 46* 45* 43*  GLUCOSE 135* 132* 156* 154*  BUN 25* 23* 27* 29*  CREATININE 0.52* 0.40* 0.60* 0.58*  CALCIUM 9.2 8.8* 8.6* 8.6*  MG  --   --   --  1.7   Liver Function Tests:  Recent Labs Lab 02/07/15 1634 02/08/15 0416  AST 13* 10*  ALT 17 11*  ALKPHOS 70 56  BILITOT 0.4 0.5  PROT 6.7 5.8*  ALBUMIN 3.6 3.0*   CBC:  Recent Labs Lab 02/07/15 1634 02/08/15 0416 02/09/15 0429  WBC 8.6 11.0* 15.4*  NEUTROABS 7.4  --   --   HGB 13.6 11.8* 11.9*  HCT 46.7 40.0 39.2  MCV 113.3* 111.7* 108.0*  PLT 142* 115* 145*   Cardiac Enzymes:  Recent Labs Lab 02/07/15 1634  TROPONINI <0.03   BNP (last 3 results)  Recent Labs  02/07/15 1634  BNP 129.0*      Recent Results (from the past 240 hour(s))  MRSA PCR Screening     Status: None   Collection Time: 02/07/15 11:14 AM  Result Value Ref Range Status   MRSA by PCR  NEGATIVE NEGATIVE Final    Comment:        The GeneXpert MRSA Assay (FDA approved for NASAL specimens only), is one component of a comprehensive MRSA colonization surveillance program. It is not intended to diagnose MRSA infection nor to guide or monitor treatment for MRSA infections.   Culture, blood (Routine X 2) w Reflex to ID Panel     Status: None (Preliminary result)   Collection Time: 02/07/15 10:10 PM  Result Value Ref Range Status   Specimen Description LEFT ANTECUBITAL  Final   Special Requests BOTTLES DRAWN AEROBIC AND ANAEROBIC 6CC  Final   Culture NO GROWTH 2 DAYS  Final   Report Status PENDING  Incomplete  Culture, blood (Routine X 2) w  Reflex to ID Panel     Status: None (Preliminary result)   Collection Time: 02/07/15 10:15 PM  Result Value Ref Range Status   Specimen Description BLOOD RIGHT ARM  Final   Special Requests BOTTLES DRAWN AEROBIC ONLY 6CC  Final   Culture NO GROWTH 2 DAYS  Final   Report Status PENDING  Incomplete       Scheduled Meds: . amLODipine  5 mg Oral Daily  . aspirin EC  81 mg Oral Daily  . atorvastatin  80 mg Oral q1800  . azithromycin  500 mg Intravenous Q24H  . cefTRIAXone (ROCEPHIN)  IV  1 g Intravenous Q24H  . enoxaparin (LOVENOX) injection  40 mg Subcutaneous Q24H  . guaiFENesin  1,200 mg Oral BID  . ipratropium-albuterol  3 mL Nebulization Q6H  . methylPREDNISolone (SOLU-MEDROL) injection  60 mg Intravenous Q6H  . mometasone-formoterol  2 puff Inhalation BID  . theophylline  200 mg Oral BID   Continuous Infusions: . sodium chloride 75 mL/hr at 02/10/15 0701    Active Problems:   COPD exacerbation (HCC)   Hypernatremia   Acute on chronic respiratory failure with hypoxia and hypercapnia (HCC)   CAP (community acquired pneumonia)   Thrombocytopenia ([place])   HTN (hypertension)    Time spent: 20 minutes   Jehanzeb Memon. MD  [facility] Pager [phone]. If 7PM-7AM, please contact night-coverage at [email or web address], password [facility] 02/10/2015, 7:36 AM  LOS: 3 days     By signing my name below, I, Rosalie Doctor, attest that this documentation has been prepared under the direction and in the presence of [facility]. MD Electronically Signed: Rosalie Doctor, Scribe. 02/10/2015 8:41am  I, Dr. Kathie Dike, personally performed the services described in this documentaiton. All medical record entries made by the scribe were at my direction and in my presence. I have reviewed the chart and agree that the record reflects my personal performance and is accurate and complete  Kathie Dike, MD, 02/10/2015 9:02 AM

## 2015-02-10 NOTE — Consult Note (Signed)
Consult requested by: [facility] Consult requested for respiratory failure:  HPI: This is a 69 year old who has a long history of COPD and who came to the emergency department with increasing shortness of breath. He has been coughing. He required BiPAP. His PCO2 was very high on admission in the high 80s but it appears that that was compensated with a pH of 7.34 so I think he probably does have significant chronic CO2 retention. He has now transitioned to high flow nasal cannula. He also has community-acquired pneumonia and is on IV antibiotics and IV steroids. He says he feels better. He has no new complaints.  Past Medical History  Diagnosis Date  . Pneumonia   . Gangrene ([place])   . Back injury 1994  . COPD (chronic obstructive pulmonary disease) (HCC)      Family History  Problem Relation Age of Onset  . Heart attack Brother   . Coronary artery disease Brother   . Cancer Brother      Social History   Social History  . Marital Status: Married    Spouse Name: [name]  . Number of Children: [name]  . Years of Education: [name]   Social History Main Topics  . Smoking status: Current Every Day Smoker -- 0.50 packs/day for 50 years  . Smokeless tobacco: None  . Alcohol Use: No  . Drug Use: No  . Sexual Activity: Not Asked   Other Topics Concern  . None   Social History Narrative     ROS: He is not able to cough up any sputum. He is not having any chest pain. No hemoptysis. No swelling of his legs. Otherwise per the history and physical    Objective: Vital signs in last 24 hours: Temp:  [97 F (36.1 C)-98.1 F (36.7 C)] 97 F (36.1 C) (12/18 0730) Pulse Rate:  [78-105] 80 (12/18 0800) Resp:  [16-27] 24 (12/18 0800) BP: (102-142)/(62-103) 126/82 mmHg (12/18 0800) SpO2:  [86 %-100 %] 98 % (12/18 0819) Weight:  [49.6 kg (109 lb 5.6 oz)] 49.6 kg (109 lb 5.6 oz) (12/18 0400) Weight change: 2.5 kg (5 lb 8.2 oz) Last BM Date: 02/06/15  Intake/Output from previous  day: 12/17 0701 - 12/18 0700 In: 2940 [P.O.:840; I.V.:1800; IV Piggyback:300] Out: 1225 [Urine:1225]  PHYSICAL EXAM He is a well-developed Caucasian male in no acute distress now. He is wearing nasal oxygen. His HEENT examination shows his pupils are reactive mucous membranes are slightly dry. His neck is supple without masses and no JVD. Chest shows bilateral wheezes. His heart is regular without gallop. His abdomen is soft without masses. Extremities showed no edema. Central nervous system examination is grossly intact  Lab Results: Basic Metabolic Panel:  Recent Labs  02/09/15 0429 02/10/15 0458  NA 140 138  K 4.9 5.0  CL 91* 93*  CO2 45* 43*  GLUCOSE 156* 154*  BUN 27* 29*  CREATININE 0.60* 0.58*  CALCIUM 8.6* 8.6*  MG  --  1.7   Liver Function Tests:  Recent Labs  02/07/15 1634 02/08/15 0416  AST 13* 10*  ALT 17 11*  ALKPHOS 70 56  BILITOT 0.4 0.5  PROT 6.7 5.8*  ALBUMIN 3.6 3.0*   No results for input(s): LIPASE, AMYLASE in the last 72 hours. No results for input(s): AMMONIA in the last 72 hours. CBC:  Recent Labs  02/07/15 1634 02/08/15 0416 02/09/15 0429  WBC 8.6 11.0* 15.4*  NEUTROABS 7.4  --   --   HGB 13.6 11.8* 11.9*  HCT 46.7 40.0 39.2  MCV 113.3* 111.7* 108.0*  PLT 142* 115* 145*   Cardiac Enzymes:  Recent Labs  02/07/15 1634  TROPONINI <0.03   BNP: No results for input(s): PROBNP in the last 72 hours. D-Dimer: No results for input(s): DDIMER in the last 72 hours. CBG: No results for input(s): GLUCAP in the last 72 hours. Hemoglobin A1C: No results for input(s): HGBA1C in the last 72 hours. Fasting Lipid Panel: No results for input(s): CHOL, HDL, LDLCALC, TRIG, CHOLHDL, LDLDIRECT in the last 72 hours. Thyroid Function Tests: No results for input(s): TSH, T4TOTAL, FREET4, T3FREE, THYROIDAB in the last 72 hours. Anemia Panel: No results for input(s): VITAMINB12, FOLATE, FERRITIN, TIBC, IRON, RETICCTPCT in the last 72  hours. Coagulation: No results for input(s): LABPROT, INR in the last 72 hours. Urine Drug Screen: Drugs of Abuse  No results found for: LABOPIA, COCAINSCRNUR, LABBENZ, AMPHETMU, THCU, LABBARB  Alcohol Level: No results for input(s): ETH in the last 72 hours. Urinalysis: No results for input(s): COLORURINE, LABSPEC, PHURINE, GLUCOSEU, HGBUR, BILIRUBINUR, KETONESUR, PROTEINUR, UROBILINOGEN, NITRITE, LEUKOCYTESUR in the last 72 hours.  Invalid input(s): APPERANCEUR Misc. Labs:   ABGS:  Recent Labs  02/09/15 0517  PHART 7.398  PO2ART 43.6*  TCO2 12.5  HCO3 40.4*     MICROBIOLOGY: Recent Results (from the past 240 hour(s))  MRSA PCR Screening     Status: None   Collection Time: 02/07/15 11:14 AM  Result Value Ref Range Status   MRSA by PCR NEGATIVE NEGATIVE Final    Comment:        The GeneXpert MRSA Assay (FDA approved for NASAL specimens only), is one component of a comprehensive MRSA colonization surveillance program. It is not intended to diagnose MRSA infection nor to guide or monitor treatment for MRSA infections.   Culture, blood (Routine X 2) w Reflex to ID Panel     Status: None (Preliminary result)   Collection Time: 02/07/15 10:10 PM  Result Value Ref Range Status   Specimen Description LEFT ANTECUBITAL  Final   Special Requests BOTTLES DRAWN AEROBIC AND ANAEROBIC 6CC  Final   Culture NO GROWTH 3 DAYS  Final   Report Status PENDING  Incomplete  Culture, blood (Routine X 2) w Reflex to ID Panel     Status: None (Preliminary result)   Collection Time: 02/07/15 10:15 PM  Result Value Ref Range Status   Specimen Description BLOOD RIGHT ARM  Final   Special Requests BOTTLES DRAWN AEROBIC ONLY 6CC  Final   Culture NO GROWTH 3 DAYS  Final   Report Status PENDING  Incomplete    Studies/Results: No results found.  Medications:  Prior to Admission:  Prescriptions prior to admission  Medication Sig Dispense Refill Last Dose  . ADVAIR DISKUS 250-50  MCG/DOSE AEPB INHALE 1 PUFF BY MOUTH TWICE A DAY RINSE MOUTH AFTER USE  12 02/07/2015 at Unknown time  . albuterol (PROVENTIL HFA;VENTOLIN HFA) 108 (90 BASE) MCG/ACT inhaler Inhale 2 puffs into the lungs every 6 (six) hours as needed for wheezing. 1 Inhaler 6 unknown at Unknown time  . amLODipine (NORVASC) 5 MG tablet Take 5 mg by mouth daily.   02/07/2015 at Unknown time  . theophylline (UNIPHYL) 400 MG 24 hr tablet TAKE 1/2 TABLET BY MOUTH TWICE DAILY AS DIRECTED  7 02/07/2015 at Unknown time  . tiotropium (SPIRIVA) 18 MCG inhalation capsule Place 1 capsule (18 mcg total) into inhaler and inhale daily. 30 capsule 6 02/07/2015 at Unknown time  . acetaminophen (  TYLENOL) 500 MG tablet Take 500-1,000 mg by mouth daily as needed for pain (Only used on occasion for pain).   Unknown at Unknown time  . guaiFENesin (MUCINEX) 600 MG 12 hr tablet Take 1 tablet (600 mg total) by mouth 2 (two) times daily. (Patient taking differently: Take 600 mg by mouth 2 (two) times daily as needed for cough or to loosen phlegm. )   Unknown at Unknown time  . ipratropium-albuterol (DUONEB) 0.5-2.5 (3) MG/3ML SOLN Take 3 mLs by nebulization every 6 (six) hours as needed.   Unknown at Unknown time   Scheduled: . amLODipine  5 mg Oral Daily  . aspirin EC  81 mg Oral Daily  . atorvastatin  80 mg Oral q1800  . azithromycin  500 mg Intravenous Q24H  . cefTRIAXone (ROCEPHIN)  IV  1 g Intravenous Q24H  . enoxaparin (LOVENOX) injection  40 mg Subcutaneous Q24H  . guaiFENesin  1,200 mg Oral BID  . ipratropium-albuterol  3 mL Nebulization Q6H  . methylPREDNISolone (SOLU-MEDROL) injection  60 mg Intravenous Q6H  . mometasone-formoterol  2 puff Inhalation BID  . theophylline  200 mg Oral BID   Continuous: . sodium chloride 75 mL/hr at 02/10/15 0701   [id number], albuterol, ondansetron **OR** ondansetron (ZOFRAN) IV  Assesment: He is admitted with COPD exacerbation, community-acquired pneumonia and acute on chronic  hypoxic and hypercapnic respiratory failure. As discussed with Dr. Roderic Palau he is at risk of oxygen induced CO2 narcosis and I agree with trying to keep his O2 saturation in the high 80s to low 90s that should reduce the risk of this. He is awake and alert now. He is on appropriate treatment for the pneumonia. Active Problems:   COPD exacerbation (HCC)   Hypernatremia   Acute on chronic respiratory failure with hypoxia and hypercapnia (HCC)   CAP (community acquired pneumonia)   Thrombocytopenia ([place])   HTN (hypertension)    Plan: Continue current treatments. Reduce his oxygen as much as possible. Agree he may be able to switch over to oral antibiotics tomorrow.    LOS: 3 days   Hennessy Bartel L 02/10/2015, 9:26 AM

## 2015-02-10 NOTE — Progress Notes (Signed)
Walked about 200 feet oxygen saturations dropped to 84 which had dropped that low even while resting. Did set in chair for about 1 hour and then back to bed with oxygen saturations of 92.

## 2015-02-11 LAB — LEGIONELLA ANTIGEN, URINE

## 2015-02-11 MED ORDER — IPRATROPIUM-ALBUTEROL 0.5-2.5 (3) MG/3ML IN SOLN
3.0000 mL | Freq: Four times a day (QID) | RESPIRATORY_TRACT | Status: DC
  Administered 2015-02-11 – 2015-02-18 (×24): 3 mL via RESPIRATORY_TRACT
  Filled 2015-02-11 (×24): qty 3

## 2015-02-11 MED ORDER — AMOXICILLIN-POT CLAVULANATE 875-125 MG PO TABS
1.0000 | ORAL_TABLET | Freq: Two times a day (BID) | ORAL | Status: DC
  Administered 2015-02-11 – 2015-02-15 (×10): 1 via ORAL
  Filled 2015-02-11 (×14): qty 1

## 2015-02-11 NOTE — Progress Notes (Signed)
[facility] PROGRESS NOTE  Dominic Martinez [id number] DOB: 1945/07/24 DOA: 02/07/2015 PCP: No primary care provider on file.  Assessment/Plan: 1. COPD exacerbation, improving with IV abx, IV steroids, and bronchodilators. Continue pulmonary hygeine. Transition to oral abx today. Wheezing resolved.  2. Acute on chronic respiratory failure with hypoxia and hypercapnia. Patient reports that he chronically uses 2L O2. In ER he was noted to be tachypneic and required BIPAP therapy. He was found to have severe hypercapnia and associated respiratory acidosis. His breathing improved with the BIPAP so he was transitioned to [place]. Currently on high flow [place] at 8L, will wean to baseline 2L as tolerated. Appreciate pulmonology input.  3. CAP. CXR revealed possible infiltrate. Remains afebrile. BC are unremarkable. Transition to oral abx. 4. Hypernatremia, likely related to dehydration. Resolved with hypotonic fluids.  5. Thrombocytopenia, unclear etiology. Possibly related to PNA. Will continue to monitor.  6. Essential HTN, stable. Continue home meds.  7. Tobacco abuse, counseled on the importance of cessation.    Code Status: Full DVT prophylaxis: Lovenox Family Communication: Discussed with patient who understands and has no concerns at this time. Disposition Plan:  Transfer to medical bed. Anticipate discharge within 1-2 days.     Consultants:  Pulmonology   Procedures:    Antibiotics:  Zithromax 12/15>>12/19  Rocephin 12/15>>12/19  Augmentin 12/19>>  HPI/Subjective: Doing ok. Breathing is improving with mild nonproductive cough.   Objective: Filed Vitals:   02/11/15 0300 02/11/15 0400  BP: 126/73 141/78  Pulse: 82 82  Temp:  97 F (36.1 C)  Resp: 24 21    Intake/Output Summary (Last 24 hours) at 02/11/15 0637 Last data filed at 02/11/15 0400  Gross per 24 hour  Intake   3270 ml  Output   2155 ml  Net   1115 ml   Filed Weights   02/09/15 0509 02/10/15 0400  02/11/15 0500  Weight: 47.1 kg (103 lb 13.4 oz) 49.6 kg (109 lb 5.6 oz) 50.1 kg (110 lb 7.2 oz)    Exam: General: NAD. Appears calm and comfortable lying in bed.  Cardiovascular: Regular rate and rhythm  Respiratory: Occasional rhonchi with no wheezing.  Abdomen: soft, non tender, no distention , bowel sounds normal Musculoskeletal: No edema b/l   Data Reviewed: Basic Metabolic Panel:  Recent Labs Lab 02/07/15 1634 02/08/15 0416 02/09/15 0429 02/10/15 0458  NA 147* 144 140 138  K 4.4 4.4 4.9 5.0  CL 92* 95* 91* 93*  CO2 >50* 46* 45* 43*  GLUCOSE 135* 132* 156* 154*  BUN 25* 23* 27* 29*  CREATININE 0.52* 0.40* 0.60* 0.58*  CALCIUM 9.2 8.8* 8.6* 8.6*  MG  --   --   --  1.7   Liver Function Tests:  Recent Labs Lab 02/07/15 1634 02/08/15 0416  AST 13* 10*  ALT 17 11*  ALKPHOS 70 56  BILITOT 0.4 0.5  PROT 6.7 5.8*  ALBUMIN 3.6 3.0*   CBC:  Recent Labs Lab 02/07/15 1634 02/08/15 0416 02/09/15 0429  WBC 8.6 11.0* 15.4*  NEUTROABS 7.4  --   --   HGB 13.6 11.8* 11.9*  HCT 46.7 40.0 39.2  MCV 113.3* 111.7* 108.0*  PLT 142* 115* 145*   Cardiac Enzymes:  Recent Labs Lab 02/07/15 1634  TROPONINI <0.03   BNP (last 3 results)  Recent Labs  02/07/15 1634  BNP 129.0*      Recent Results (from the past 240 hour(s))  MRSA PCR Screening     Status: None   Collection Time:  02/07/15 11:14 AM  Result Value Ref Range Status   MRSA by PCR NEGATIVE NEGATIVE Final    Comment:        The GeneXpert MRSA Assay (FDA approved for NASAL specimens only), is one component of a comprehensive MRSA colonization surveillance program. It is not intended to diagnose MRSA infection nor to guide or monitor treatment for MRSA infections.   Culture, blood (Routine X 2) w Reflex to ID Panel     Status: None (Preliminary result)   Collection Time: 02/07/15 10:10 PM  Result Value Ref Range Status   Specimen Description LEFT ANTECUBITAL  Final   Special Requests BOTTLES  DRAWN AEROBIC AND ANAEROBIC 6CC  Final   Culture NO GROWTH 3 DAYS  Final   Report Status PENDING  Incomplete  Culture, blood (Routine X 2) w Reflex to ID Panel     Status: None (Preliminary result)   Collection Time: 02/07/15 10:15 PM  Result Value Ref Range Status   Specimen Description BLOOD RIGHT ARM  Final   Special Requests BOTTLES DRAWN AEROBIC ONLY 6CC  Final   Culture NO GROWTH 3 DAYS  Final   Report Status PENDING  Incomplete       Scheduled Meds: . amLODipine  5 mg Oral Daily  . aspirin EC  81 mg Oral Daily  . atorvastatin  80 mg Oral q1800  . azithromycin  500 mg Intravenous Q24H  . cefTRIAXone (ROCEPHIN)  IV  1 g Intravenous Q24H  . enoxaparin (LOVENOX) injection  40 mg Subcutaneous Q24H  . guaiFENesin  1,200 mg Oral BID  . ipratropium-albuterol  3 mL Nebulization Q6H WA  . methylPREDNISolone (SOLU-MEDROL) injection  60 mg Intravenous Q6H  . mometasone-formoterol  2 puff Inhalation BID  . theophylline  200 mg Oral BID   Continuous Infusions: . sodium chloride 75 mL/hr at 02/10/15 2349    Active Problems:   COPD exacerbation (HCC)   Hypernatremia   Acute on chronic respiratory failure with hypoxia and hypercapnia (HCC)   CAP (community acquired pneumonia)   Thrombocytopenia ([place])   HTN (hypertension)    Time spent: 20 minutes   Jehanzeb Memon. MD  [facility] Pager [phone]. If 7PM-7AM, please contact night-coverage at [email or web address], password [facility] 02/11/2015, 6:37 AM  LOS: 4 days   By signing my name below, I, Rennis Harding, attest that this documentation has been prepared under the direction and in the presence of Kathie Dike, MD. Electronically signed: Rennis Harding, Scribe. 02/11/2015 8:50am   I, Dr. Kathie Dike, personally performed the services described in this documentaiton. All medical record entries made by the scribe were at my direction and in my presence. I have reviewed the chart and agree that the record reflects my  personal performance and is accurate and complete  Kathie Dike, MD, 02/11/2015 8:59 AM

## 2015-02-11 NOTE — Progress Notes (Addendum)
Pt a/o.vss. IV patent. No complaints of any distress. Report given to J.Vaughn, RN. Pt to be transferred to room 301 via wheelchair with nursing staff.

## 2015-02-11 NOTE — Progress Notes (Signed)
PATIENT TREATMENTS DECREASED TO Q 6 WHILE AWAKE.

## 2015-02-11 NOTE — Progress Notes (Signed)
Subjective: He says he feels okay. He is a little bit better. I think he may be a little bit confused because he says he is not getting anything for his breathing. His oxygen has been able to reduce to 5-6 L. He is not coughing up any sputum  Objective: Vital signs in last 24 hours: Temp:  [97 F (36.1 C)-98.1 F (36.7 C)] 97.1 F (36.2 C) (12/19 0816) Pulse Rate:  [78-107] 107 (12/19 0825) Resp:  [17-27] 22 (12/19 0825) BP: (120-148)/(70-86) 139/83 mmHg (12/19 0800) SpO2:  [76 %-100 %] 93 % (12/19 0826) Weight:  [50.1 kg (110 lb 7.2 oz)] 50.1 kg (110 lb 7.2 oz) (12/19 0500) Weight change: 0.5 kg (1 lb 1.6 oz) Last BM Date: 02/06/15  Intake/Output from previous day: 12/18 0701 - 12/19 0700 In: 3420 [P.O.:1320; I.V.:1800; IV Piggyback:300] Out: 2155 [Urine:2155]  PHYSICAL EXAM General appearance: alert, cooperative and mild distress Resp: rhonchi bilaterally Cardio: regular rate and rhythm, S1, S2 normal, no murmur, click, rub or gallop GI: soft, non-tender; bowel sounds normal; no masses,  no organomegaly Extremities: extremities normal, atraumatic, no cyanosis or edema  Lab Results:  Results for orders placed or performed during the hospital encounter of 02/07/15 (from the past 48 hour(s))  Basic metabolic panel     Status: Abnormal   Collection Time: 02/10/15  4:58 AM  Result Value Ref Range   Sodium 138 135 - 145 mmol/L   Potassium 5.0 3.5 - 5.1 mmol/L   Chloride 93 (L) 101 - 111 mmol/L   CO2 43 (H) 22 - 32 mmol/L   Glucose, Bld 154 (H) 65 - 99 mg/dL   BUN 29 (H) 6 - 20 mg/dL   Creatinine, Ser 0.58 (L) 0.61 - 1.24 mg/dL   Calcium 8.6 (L) 8.9 - 10.3 mg/dL   GFR calc non Af Amer >60 >60 mL/min   GFR calc Af Amer >60 >60 mL/min    Comment: (NOTE) The eGFR has been calculated using the CKD EPI equation. This calculation has not been validated in all clinical situations. eGFR's persistently <60 mL/min signify possible Chronic Kidney Disease.    Anion gap 2 (L) 5 - 15   Magnesium     Status: None   Collection Time: 02/10/15  4:58 AM  Result Value Ref Range   Magnesium 1.7 1.7 - 2.4 mg/dL    ABGS  Recent Labs  02/09/15 0517  PHART 7.398  PO2ART 43.6*  TCO2 12.5  HCO3 40.4*   CULTURES Recent Results (from the past 240 hour(s))  MRSA PCR Screening     Status: None   Collection Time: 02/07/15 11:14 AM  Result Value Ref Range Status   MRSA by PCR NEGATIVE NEGATIVE Final    Comment:        The GeneXpert MRSA Assay (FDA approved for NASAL specimens only), is one component of a comprehensive MRSA colonization surveillance program. It is not intended to diagnose MRSA infection nor to guide or monitor treatment for MRSA infections.   Culture, blood (Routine X 2) w Reflex to ID Panel     Status: None (Preliminary result)   Collection Time: 02/07/15 10:10 PM  Result Value Ref Range Status   Specimen Description LEFT ANTECUBITAL  Final   Special Requests BOTTLES DRAWN AEROBIC AND ANAEROBIC 6CC  Final   Culture NO GROWTH 3 DAYS  Final   Report Status PENDING  Incomplete  Culture, blood (Routine X 2) w Reflex to ID Panel     Status: None (Preliminary  result)   Collection Time: 02/07/15 10:15 PM  Result Value Ref Range Status   Specimen Description BLOOD RIGHT ARM  Final   Special Requests BOTTLES DRAWN AEROBIC ONLY 6CC  Final   Culture NO GROWTH 3 DAYS  Final   Report Status PENDING  Incomplete   Studies/Results: No results found.  Medications:  Prior to Admission:  Prescriptions prior to admission  Medication Sig Dispense Refill Last Dose  . ADVAIR DISKUS 250-50 MCG/DOSE AEPB INHALE 1 PUFF BY MOUTH TWICE A DAY RINSE MOUTH AFTER USE  12 02/07/2015 at Unknown time  . albuterol (PROVENTIL HFA;VENTOLIN HFA) 108 (90 BASE) MCG/ACT inhaler Inhale 2 puffs into the lungs every 6 (six) hours as needed for wheezing. 1 Inhaler 6 unknown at Unknown time  . amLODipine (NORVASC) 5 MG tablet Take 5 mg by mouth daily.   02/07/2015 at Unknown time  .  theophylline (UNIPHYL) 400 MG 24 hr tablet TAKE 1/2 TABLET BY MOUTH TWICE DAILY AS DIRECTED  7 02/07/2015 at Unknown time  . tiotropium (SPIRIVA) 18 MCG inhalation capsule Place 1 capsule (18 mcg total) into inhaler and inhale daily. 30 capsule 6 02/07/2015 at Unknown time  . acetaminophen (TYLENOL) 500 MG tablet Take 500-1,000 mg by mouth daily as needed for pain (Only used on occasion for pain).   Unknown at Unknown time  . guaiFENesin (MUCINEX) 600 MG 12 hr tablet Take 1 tablet (600 mg total) by mouth 2 (two) times daily. (Patient taking differently: Take 600 mg by mouth 2 (two) times daily as needed for cough or to loosen phlegm. )   Unknown at Unknown time  . ipratropium-albuterol (DUONEB) 0.5-2.5 (3) MG/3ML SOLN Take 3 mLs by nebulization every 6 (six) hours as needed.   Unknown at Unknown time   Scheduled: . amLODipine  5 mg Oral Daily  . aspirin EC  81 mg Oral Daily  . atorvastatin  80 mg Oral q1800  . azithromycin  500 mg Intravenous Q24H  . cefTRIAXone (ROCEPHIN)  IV  1 g Intravenous Q24H  . enoxaparin (LOVENOX) injection  40 mg Subcutaneous Q24H  . guaiFENesin  1,200 mg Oral BID  . ipratropium-albuterol  3 mL Nebulization Q6H WA  . methylPREDNISolone (SOLU-MEDROL) injection  60 mg Intravenous Q6H  . mometasone-formoterol  2 puff Inhalation BID  . theophylline  200 mg Oral BID   Continuous: . sodium chloride 75 mL/hr at 02/10/15 2349   SHF:[id number], albuterol, ondansetron **OR** ondansetron (ZOFRAN) IV  Assesment: He is admitted with acute on chronic hypoxic and hypercapnic respiratory failure. He initially required BiPAP. Since then he has required high flow oxygen. He is improving and his oxygen flow has been reduced. He has community-acquired pneumonia which is improving with antibiotics and steroids. Active Problems:   COPD exacerbation (HCC)   Hypernatremia   Acute on chronic respiratory failure with hypoxia and hypercapnia (HCC)   CAP (community acquired pneumonia)    Thrombocytopenia ([place])   HTN (hypertension)    Plan: Continue current treatments. Overall I think he is better.    LOS: 4 days   Haruko Mersch L 02/11/2015, 8:36 AM

## 2015-02-12 LAB — BLOOD GAS, ARTERIAL
ACID-BASE EXCESS: 14.1 mmol/L — AB (ref 0.0–2.0)
BICARBONATE: 34.8 meq/L — AB (ref 20.0–24.0)
Drawn by: 277331
O2 CONTENT: 10 L/min
O2 SAT: 74.6 %
PCO2 ART: 77.8 mmHg — AB (ref 35.0–45.0)
PH ART: 7.336 — AB (ref 7.350–7.450)
PO2 ART: 43.6 mmHg — AB (ref 80.0–100.0)
Patient temperature: 37

## 2015-02-12 LAB — CULTURE, BLOOD (ROUTINE X 2)
CULTURE: NO GROWTH
CULTURE: NO GROWTH

## 2015-02-12 LAB — THEOPHYLLINE LEVEL: THEOPHYLLINE LVL: 6.7 ug/mL — AB (ref 10.0–20.0)

## 2015-02-12 NOTE — Progress Notes (Signed)
**Note De-Identified Eusebio Blazejewski Obfuscation** HFNC increased to 6L; patient with SAT 91% on 4L.Marland Kitchen  RRT to cont. To monitor

## 2015-02-12 NOTE — Progress Notes (Signed)
Pt. O2 % dropped after leaving his nasal cannula off after going to bathroom (per Pt. Statement). Pt. Stated his cannula was off for approximately 1 hour.

## 2015-02-12 NOTE — Progress Notes (Addendum)
Removed high flow nasal cannula and replaced with a regular nasal cannula on patient with 3 liters of oxygen.  Will reassess O2 saturation after 10 minutes.  Pt. Unable to tolerate 3 liters O2, increased to 6 liters O2 and Pt. O2 saturation increased to 90%.

## 2015-02-12 NOTE — Progress Notes (Signed)
Subjective: He says he feels better. His breathing is better. He is on oral antibiotics. He is still on high flow oxygen at about 8 L now. His current DME company cannot provide high flow nasal cannula so he may have to switch if we can't get him down from his current level of oxygen  Objective: Vital signs in last 24 hours: Temp:  [97.6 F (36.4 C)-98.2 F (36.8 C)] 98.2 F (36.8 C) (12/20 0559) Pulse Rate:  [77-98] 89 (12/20 0821) Resp:  [18-20] 20 (12/20 0821) BP: (128-134)/(67-97) 128/67 mmHg (12/20 0559) SpO2:  [88 %-99 %] 88 % (12/20 0821) Weight change:  Last BM Date: 02/06/15  Intake/Output from previous day: 12/19 0701 - 12/20 0700 In: 2310 [P.O.:580; I.V.:1730] Out: 1550 [Urine:1550]  PHYSICAL EXAM General appearance: alert, cooperative and mild distress Resp: rhonchi bilaterally Cardio: regular rate and rhythm, S1, S2 normal, no murmur, click, rub or gallop GI: soft, non-tender; bowel sounds normal; no masses,  no organomegaly Extremities: extremities normal, atraumatic, no cyanosis or edema  Lab Results:  No results found for this or any previous visit (from the past 48 hour(s)).  ABGS No results for input(s): PHART, PO2ART, TCO2, HCO3 in the last 72 hours.  Invalid input(s): PCO2 CULTURES Recent Results (from the past 240 hour(s))  MRSA PCR Screening     Status: None   Collection Time: 02/07/15 11:14 AM  Result Value Ref Range Status   MRSA by PCR NEGATIVE NEGATIVE Final    Comment:        The GeneXpert MRSA Assay (FDA approved for NASAL specimens only), is one component of a comprehensive MRSA colonization surveillance program. It is not intended to diagnose MRSA infection nor to guide or monitor treatment for MRSA infections.   Culture, blood (Routine X 2) w Reflex to ID Panel     Status: None (Preliminary result)   Collection Time: 02/07/15 10:10 PM  Result Value Ref Range Status   Specimen Description LEFT ANTECUBITAL  Final   Special Requests  BOTTLES DRAWN AEROBIC AND ANAEROBIC 6CC  Final   Culture NO GROWTH 4 DAYS  Final   Report Status PENDING  Incomplete  Culture, blood (Routine X 2) w Reflex to ID Panel     Status: None (Preliminary result)   Collection Time: 02/07/15 10:15 PM  Result Value Ref Range Status   Specimen Description BLOOD RIGHT ARM  Final   Special Requests BOTTLES DRAWN AEROBIC ONLY 6CC  Final   Culture NO GROWTH 4 DAYS  Final   Report Status PENDING  Incomplete   Studies/Results: No results found.  Medications:  Prior to Admission:  Prescriptions prior to admission  Medication Sig Dispense Refill Last Dose  . ADVAIR DISKUS 250-50 MCG/DOSE AEPB INHALE 1 PUFF BY MOUTH TWICE A DAY RINSE MOUTH AFTER USE  12 02/07/2015 at Unknown time  . albuterol (PROVENTIL HFA;VENTOLIN HFA) 108 (90 BASE) MCG/ACT inhaler Inhale 2 puffs into the lungs every 6 (six) hours as needed for wheezing. 1 Inhaler 6 unknown at Unknown time  . amLODipine (NORVASC) 5 MG tablet Take 5 mg by mouth daily.   02/07/2015 at Unknown time  . theophylline (UNIPHYL) 400 MG 24 hr tablet TAKE 1/2 TABLET BY MOUTH TWICE DAILY AS DIRECTED  7 02/07/2015 at Unknown time  . tiotropium (SPIRIVA) 18 MCG inhalation capsule Place 1 capsule (18 mcg total) into inhaler and inhale daily. 30 capsule 6 02/07/2015 at Unknown time  . acetaminophen (TYLENOL) 500 MG tablet Take 500-1,000 mg by mouth  daily as needed for pain (Only used on occasion for pain).   Unknown at Unknown time  . guaiFENesin (MUCINEX) 600 MG 12 hr tablet Take 1 tablet (600 mg total) by mouth 2 (two) times daily. (Patient taking differently: Take 600 mg by mouth 2 (two) times daily as needed for cough or to loosen phlegm. )   Unknown at Unknown time  . ipratropium-albuterol (DUONEB) 0.5-2.5 (3) MG/3ML SOLN Take 3 mLs by nebulization every 6 (six) hours as needed.   Unknown at Unknown time   Scheduled: . amLODipine  5 mg Oral Daily  . amoxicillin-clavulanate  1 tablet Oral Q12H  . aspirin EC  81 mg  Oral Daily  . atorvastatin  80 mg Oral q1800  . enoxaparin (LOVENOX) injection  40 mg Subcutaneous Q24H  . guaiFENesin  1,200 mg Oral BID  . ipratropium-albuterol  3 mL Nebulization Q6H WA  . methylPREDNISolone (SOLU-MEDROL) injection  60 mg Intravenous Q6H  . mometasone-formoterol  2 puff Inhalation BID  . theophylline  200 mg Oral BID   Continuous: . sodium chloride 75 mL/hr (02/12/15 0426)   [id number], albuterol, ondansetron **OR** ondansetron (ZOFRAN) IV  Assesment: He is admitted with COPD exacerbation and community-acquired pneumonia and acute on chronic hypoxic and hypercapnic respiratory failure. He required BiPAP initially and now is on high flow oxygen. He is improving but may need high flow oxygen concentrator at home. Active Problems:   COPD exacerbation (HCC)   Hypernatremia   Acute on chronic respiratory failure with hypoxia and hypercapnia (HCC)   CAP (community acquired pneumonia)   Thrombocytopenia ([place])   HTN (hypertension)    Plan: Continue current treatment. Continue to try to wean oxygen    LOS: 5 days   Claudell Wohler L 02/12/2015, 8:52 AM

## 2015-02-12 NOTE — Care Management Note (Signed)
Case Management Note  Patient Details  Name: Dominic Martinez MRN: [id number] Date of Birth: 12-Mar-1945  Subjective/Objective:                    Action/Plan:   Expected Discharge Date:  02/11/15               Expected Discharge Plan:  Home/Self Care  In-House Referral:  NA  Discharge planning Services  CM Consult  Post Acute Care Choice:  NA Choice offered to:  NA  DME Arranged:    DME Agency:     HH Arranged:    [place] Agency:     Status of Service:  Completed, signed off  Medicare Important Message Given:    Date Medicare IM Given:    Medicare IM give by:    Date Additional Medicare IM Given:    Additional Medicare Important Message give by:     If discussed at [place] of Stay Meetings, dates discussed:02/12/15    Additional Comments:  Joylene Draft, RN 02/12/2015, 4:04 PM

## 2015-02-12 NOTE — Progress Notes (Signed)
[facility] PROGRESS NOTE  Dominic Martinez [id number] DOB: [birth year] DOA: 02/07/2015 PCP: No primary care provider on file.  Summary:  This is a 69 year old patient with chronic respiratory failure on 2 L of oxygen related to advanced COPD who presents to the hospital with shortness of breath and was found to be in acute on chronic respiratory failure with hypercapnia. He was initially started on BiPAP therapy with improvement in his mental status as well as his respiratory status. He was subsequently weaned from BiPAP and started on high flow nasal cannula. He is on treatment for COPD with steroids, bronchodilators and antibiotics. Chest x-ray also indicated possible pneumonia and is being treated appropriately with antibiotics. Pulmonology is following. His progress has been slow but he continues to wean down on oxygen. Anticipate discharge in 1-2 days.  Assessment/Plan: 1. COPD exacerbation, progress has been slow, but patient is improving. Continue pulmonary hygeine. Transitioned to oral abx.  2. Acute on chronic respiratory failure with hypoxia and hypercapnia. Patient reports that he chronically uses 2L O2. In ER he was noted to be tachypneic and required BIPAP therapy. He was found to have severe hypercapnia and associated respiratory acidosis. His breathing improved with the BIPAP so he was transitioned to [place]. Able to wean O2 requirements to 5-6L, but he is still on high flow. Will try to wean to baseline 2L as tolerated. Appreciate pulmonology input.  3. CAP. CXR revealed possible infiltrate. Remains afebrile. BC are unremarkable.  4. Hypernatremia, likely related to dehydration. Resolved with hypotonic fluids.  5. Thrombocytopenia, unclear etiology. Possibly related to PNA. Will continue to monitor.  6. Essential HTN, stable. Continue home meds.  7. Tobacco abuse, counseled on the importance of cessation.    Code Status: Full DVT prophylaxis: Lovenox Family Communication:  Discussed with patient who understands and has no concerns at this time. Disposition Plan:  Anticipate discharge within 1-2 days.     Consultants:  Pulmonology   Procedures:    Antibiotics:  Zithromax 12/15>>12/19  Rocephin 12/15>>12/19  Augmentin 12/19>>  HPI/Subjective: Feels that breathing is improving. Still has cough. No wheezing  Objective: Filed Vitals:   02/11/15 2217 02/12/15 0559  BP: 134/97 128/67  Pulse: 98 77  Temp: 98.1 F (36.7 C) 98.2 F (36.8 C)  Resp: 20 20    Intake/Output Summary (Last 24 hours) at 02/12/15 0641 Last data filed at 02/12/15 0522  Gross per 24 hour  Intake   1410 ml  Output   1550 ml  Net   -140 ml   Filed Weights   02/09/15 0509 02/10/15 0400 02/11/15 0500  Weight: 47.1 kg (103 lb 13.4 oz) 49.6 kg (109 lb 5.6 oz) 50.1 kg (110 lb 7.2 oz)    Exam: General: NAD, looks comfortable Cardiovascular: RRR, S1, S2  Respiratory: clear bilaterally, No wheezing, rales or rhonchi Abdomen: soft, non tender, no distention , bowel sounds normal Musculoskeletal: 1+ edema b/l   Data Reviewed: Basic Metabolic Panel:  Recent Labs Lab 02/07/15 1634 02/08/15 0416 02/09/15 0429 02/10/15 0458  NA 147* 144 140 138  K 4.4 4.4 4.9 5.0  CL 92* 95* 91* 93*  CO2 >50* 46* 45* 43*  GLUCOSE 135* 132* 156* 154*  BUN 25* 23* 27* 29*  CREATININE 0.52* 0.40* 0.60* 0.58*  CALCIUM 9.2 8.8* 8.6* 8.6*  MG  --   --   --  1.7   Liver Function Tests:  Recent Labs Lab 02/07/15 1634 02/08/15 0416  AST 13* 10*  ALT 17 11*  ALKPHOS 70 56  BILITOT 0.4 0.5  PROT 6.7 5.8*  ALBUMIN 3.6 3.0*   CBC:  Recent Labs Lab 02/07/15 1634 02/08/15 0416 02/09/15 0429  WBC 8.6 11.0* 15.4*  NEUTROABS 7.4  --   --   HGB 13.6 11.8* 11.9*  HCT 46.7 40.0 39.2  MCV 113.3* 111.7* 108.0*  PLT 142* 115* 145*   Cardiac Enzymes:  Recent Labs Lab 02/07/15 1634  TROPONINI <0.03   BNP (last 3 results)  Recent Labs  02/07/15 1634  BNP 129.0*       Recent Results (from the past 240 hour(s))  MRSA PCR Screening     Status: None   Collection Time: 02/07/15 11:14 AM  Result Value Ref Range Status   MRSA by PCR NEGATIVE NEGATIVE Final    Comment:        The GeneXpert MRSA Assay (FDA approved for NASAL specimens only), is one component of a comprehensive MRSA colonization surveillance program. It is not intended to diagnose MRSA infection nor to guide or monitor treatment for MRSA infections.   Culture, blood (Routine X 2) w Reflex to ID Panel     Status: None (Preliminary result)   Collection Time: 02/07/15 10:10 PM  Result Value Ref Range Status   Specimen Description LEFT ANTECUBITAL  Final   Special Requests BOTTLES DRAWN AEROBIC AND ANAEROBIC 6CC  Final   Culture NO GROWTH 4 DAYS  Final   Report Status PENDING  Incomplete  Culture, blood (Routine X 2) w Reflex to ID Panel     Status: None (Preliminary result)   Collection Time: 02/07/15 10:15 PM  Result Value Ref Range Status   Specimen Description BLOOD RIGHT ARM  Final   Special Requests BOTTLES DRAWN AEROBIC ONLY 6CC  Final   Culture NO GROWTH 4 DAYS  Final   Report Status PENDING  Incomplete       Scheduled Meds: . amLODipine  5 mg Oral Daily  . amoxicillin-clavulanate  1 tablet Oral Q12H  . aspirin EC  81 mg Oral Daily  . atorvastatin  80 mg Oral q1800  . enoxaparin (LOVENOX) injection  40 mg Subcutaneous Q24H  . guaiFENesin  1,200 mg Oral BID  . ipratropium-albuterol  3 mL Nebulization Q6H WA  . methylPREDNISolone (SOLU-MEDROL) injection  60 mg Intravenous Q6H  . mometasone-formoterol  2 puff Inhalation BID  . theophylline  200 mg Oral BID   Continuous Infusions: . sodium chloride 75 mL/hr (02/12/15 0426)    Active Problems:   COPD exacerbation (HCC)   Hypernatremia   Acute on chronic respiratory failure with hypoxia and hypercapnia (HCC)   CAP (community acquired pneumonia)   Thrombocytopenia ([place])   HTN (hypertension)    Time spent:  20 minutes   Md Smola. MD  [facility] Pager [phone]. If 7PM-7AM, please contact night-coverage at [email or web address], password [facility] 02/12/2015, 6:41 AM  LOS: 5 days

## 2015-02-13 MED ORDER — FUROSEMIDE 20 MG PO TABS
20.0000 mg | ORAL_TABLET | Freq: Every day | ORAL | Status: DC
  Administered 2015-02-13 – 2015-02-14 (×2): 20 mg via ORAL
  Filled 2015-02-13 (×2): qty 1

## 2015-02-13 NOTE — Progress Notes (Signed)
PROGRESS NOTE  Dominic Martinez [id number] DOB: 26-Oct-1945 DOA: 02/07/2015 PCP: No primary care provider on file.  Summary:  69 year old patient with chronic respiratory failure on 2 L of oxygen related to advanced COPD who presented to the hospital with shortness of breath and was found to be in acute on chronic respiratory failure with hypercapnia. He was initially started on BiPAP therapy with improvement in his mental status as well as his respiratory status. He was subsequently weaned from BiPAP and started on high flow nasal cannula. He is receiving steroids, bronchodilators and antibiotics with slow improvement. Chest x-ray also indicated possible pneumonia so he was started on IV abx. Pulmonology is following. His progress has been slow but he continues to wean down on oxygen.    Assessment/Plan: 1. COPD exacerbation, slow to improve. 2. Acute on chronic respiratory failure with hypoxia and hypercapnia. In ER he was noted to be tachypneic and required BIPAP therapy. He was found to have severe hypercapnia and associated respiratory acidosis. His breathing improved with the BIPAP so he was transitioned to [place]. Able to wean O2 requirements however he is still on high flow.  Appreciate pulmonology input.  3. CAP. CXR revealed possible infiltrate. Remains afebrile. WBC wnl. BC are unremarkable.  4. Thrombocytopenia, mild, stable, secondary to infection. 5. Essential HTN, stable. Continue home meds.  6. Tobacco abuse, counseled on the importance of cessation.   Slowly improving. Continue bronchodilators, oral abx, and steroids. Continue to wean O2 as tolerated to baseline 2L.   +5 liters since admission, will start Lasix.  Code Status: Full DVT prophylaxis: Lovenox Family Communication: none present, patient understands plan Disposition Plan: home  Murray Hodgkins, MD  [facility]  Pager [phone] If 7PM-7AM, please contact night-coverage at [email or web address], password  [facility] 02/13/2015, 9:16 AM  LOS: 6 days   Consultants:  Pulmonology  Procedures:    Antibiotics:  Zithromax 12/15>>12/19  Rocephin 12/15>>12/19  Augmentin 12/19>>  HPI/Subjective: Breathing a little better compared to yesterday but still SOB. Eating ok.    Objective: Filed Vitals:   02/12/15 2200 02/13/15 0214 02/13/15 0616 02/13/15 0727  BP: 144/79  154/90   Pulse: 89  83 88  Temp: 98.4 F (36.9 C)  97.8 F (36.6 C)   TempSrc: Oral  Oral   Resp: 20  20   Height:      Weight:      SpO2: 92% 95% 94% 93%    Intake/Output Summary (Last 24 hours) at 02/13/15 0916 Last data filed at 02/13/15 0600  Gross per 24 hour  Intake    240 ml  Output   1200 ml  Net   -960 ml     Filed Weights   02/09/15 0509 02/10/15 0400 02/11/15 0500  Weight: 47.1 kg (103 lb 13.4 oz) 49.6 kg (109 lb 5.6 oz) 50.1 kg (110 lb 7.2 oz)    Exam:    VSS, afebrile, not hypoxic General:  Appears calm. Mildly uncomfortable. Nontoxic. Cardiovascular: RRR, no m/r/g. No LE edema. Respiratory: CTA right without w/r/r. Diminished BS on left with inspiratory crackles. Psychiatric: grossly normal mood and affect, speech fluent and appropriate  New data reviewed:  +5 liters since admit  ABG 12/20 : 7.336/77/43  Scheduled Meds: . amLODipine  5 mg Oral Daily  . amoxicillin-clavulanate  1 tablet Oral Q12H  . aspirin EC  81 mg Oral Daily  . atorvastatin  80 mg Oral q1800  . enoxaparin (LOVENOX) injection  40 mg Subcutaneous Q24H  . guaiFENesin  1,200  mg Oral BID  . ipratropium-albuterol  3 mL Nebulization Q6H WA  . methylPREDNISolone (SOLU-MEDROL) injection  60 mg Intravenous Q6H  . mometasone-formoterol  2 puff Inhalation BID  . theophylline  200 mg Oral BID   Continuous Infusions: . sodium chloride 75 mL/hr at 02/12/15 1821    Active Problems:   COPD exacerbation (HCC)   Hypernatremia   Acute on chronic respiratory failure with hypoxia and hypercapnia (HCC)   CAP (community acquired  pneumonia)   Thrombocytopenia ([place])   HTN (hypertension)   Time spent 25 minutes   By signing my name below, I, Rosalie Doctor attest that this documentation has been prepared under the direction and in the presence of Murray Hodgkins, MD Electronically signed: Rosalie Doctor, Scribe.  02/13/2015  I personally performed the services described in this documentation. All medical record entries made by the scribe were at my direction. I have reviewed the chart and agree that the record reflects my personal performance and is accurate and complete. Murray Hodgkins, MD

## 2015-02-13 NOTE — Progress Notes (Signed)
Subjective: He says he feels okay. He has no new complaints. He had problems with hypoxia yesterday and apparently what happened is that he got up to go to the bathroom and neglected to put his oxygen back on when he got back to the bed. He was then found to have very low oxygen saturation but this fairly quickly came back to his baseline. However he is still requiring 8 L or so to maintain oxygen saturation. He says he feels okay otherwise. He gives me a history today that he was diagnosed with lung cancer in [place] and told he only had 6 months to live about a year ago. I will see if we can obtain those records  Objective: Vital signs in last 24 hours: Temp:  [97.8 F (36.6 C)-98.4 F (36.9 C)] 97.8 F (36.6 C) (12/21 0616) Pulse Rate:  [83-95] 88 (12/21 0727) Resp:  [20] 20 (12/21 0616) BP: (144-154)/(76-90) 154/90 mmHg (12/21 0616) SpO2:  [64 %-95 %] 93 % (12/21 0727) Weight change:  Last BM Date: 02/06/15  Intake/Output from previous day: 12/20 0701 - 12/21 0700 In: 480 [P.O.:480] Out: 1200 [Urine:1200]  PHYSICAL EXAM General appearance: alert, cooperative and no distress Resp: rhonchi bilaterally Cardio: regular rate and rhythm, S1, S2 normal, no murmur, click, rub or gallop GI: soft, non-tender; bowel sounds normal; no masses,  no organomegaly Extremities: extremities normal, atraumatic, no cyanosis or edema  Lab Results:  Results for orders placed or performed during the hospital encounter of 02/07/15 (from the past 48 hour(s))  Blood gas, arterial     Status: Abnormal   Collection Time: 02/12/15  3:10 PM  Result Value Ref Range   O2 Content 10.0 L/min   Delivery systems NASAL CANNULA    pH, Arterial 7.336 (L) 7.350 - 7.450   pCO2 arterial 77.8 (HH) 35.0 - 45.0 mmHg    Comment: CRITICAL RESULT CALLED TO, READ BACK BY AND VERIFIED WITH: JASON VAUGHN,RN AT 1521, BY WENDY VIA,RRT,RCP, ON 02/12/2015    pO2, Arterial 43.6 (L) 80.0 - 100.0 mmHg   Bicarbonate  34.8 (H) 20.0 - 24.0 mEq/L   Acid-Base Excess 14.1 (H) 0.0 - 2.0 mmol/L   O2 Saturation 74.6 %   Patient temperature 37.0    Collection site RIGHT RADIAL    Drawn by [id number]    Sample type ARTERIAL DRAW    [place]ens test (pass/fail) PASS PASS    ABGS  Recent Labs  02/12/15 1510  PHART 7.336*  PO2ART 43.6*  HCO3 34.8*   CULTURES Recent Results (from the past 240 hour(s))  MRSA PCR Screening     Status: None   Collection Time: 02/07/15 11:14 AM  Result Value Ref Range Status   MRSA by PCR NEGATIVE NEGATIVE Final    Comment:        The GeneXpert MRSA Assay (FDA approved for NASAL specimens only), is one component of a comprehensive MRSA colonization surveillance program. It is not intended to diagnose MRSA infection nor to guide or monitor treatment for MRSA infections.   Culture, blood (Routine X 2) w Reflex to ID Panel     Status: None   Collection Time: 02/07/15 10:10 PM  Result Value Ref Range Status   Specimen Description BLOOD LEFT ANTECUBITAL  Final   Special Requests BOTTLES DRAWN AEROBIC AND ANAEROBIC 6CC  Final   Culture NO GROWTH 5 DAYS  Final   Report Status 02/12/2015 FINAL  Final  Culture, blood (Routine X 2) w Reflex to ID Panel  Status: None   Collection Time: 02/07/15 10:15 PM  Result Value Ref Range Status   Specimen Description BLOOD RIGHT ARM  Final   Special Requests BOTTLES DRAWN AEROBIC ONLY [place]  Final   Culture NO GROWTH 5 DAYS  Final   Report Status 02/12/2015 FINAL  Final   Studies/Results: No results found.  Medications:  Prior to Admission:  Prescriptions prior to admission  Medication Sig Dispense Refill Last Dose  . ADVAIR DISKUS 250-50 MCG/DOSE AEPB INHALE 1 PUFF BY MOUTH TWICE A DAY RINSE MOUTH AFTER USE  12 02/07/2015 at Unknown time  . albuterol (PROVENTIL HFA;VENTOLIN HFA) 108 (90 BASE) MCG/ACT inhaler Inhale 2 puffs into the lungs every 6 (six) hours as needed for wheezing. 1 Inhaler 6 unknown at Unknown time  . amLODipine  (NORVASC) 5 MG tablet Take 5 mg by mouth daily.   02/07/2015 at Unknown time  . theophylline (UNIPHYL) 400 MG 24 hr tablet TAKE 1/2 TABLET BY MOUTH TWICE DAILY AS DIRECTED  7 02/07/2015 at Unknown time  . tiotropium (SPIRIVA) 18 MCG inhalation capsule Place 1 capsule (18 mcg total) into inhaler and inhale daily. 30 capsule 6 02/07/2015 at Unknown time  . acetaminophen (TYLENOL) 500 MG tablet Take 500-1,000 mg by mouth daily as needed for pain (Only used on occasion for pain).   Unknown at Unknown time  . guaiFENesin (MUCINEX) 600 MG 12 hr tablet Take 1 tablet (600 mg total) by mouth 2 (two) times daily. (Patient taking differently: Take 600 mg by mouth 2 (two) times daily as needed for cough or to loosen phlegm. )   Unknown at Unknown time  . ipratropium-albuterol (DUONEB) 0.5-2.5 (3) MG/3ML SOLN Take 3 mLs by nebulization every 6 (six) hours as needed.   Unknown at Unknown time   Scheduled: . amLODipine  5 mg Oral Daily  . amoxicillin-clavulanate  1 tablet Oral Q12H  . aspirin EC  81 mg Oral Daily  . atorvastatin  80 mg Oral q1800  . enoxaparin (LOVENOX) injection  40 mg Subcutaneous Q24H  . guaiFENesin  1,200 mg Oral BID  . ipratropium-albuterol  3 mL Nebulization Q6H WA  . methylPREDNISolone (SOLU-MEDROL) injection  60 mg Intravenous Q6H  . mometasone-formoterol  2 puff Inhalation BID  . theophylline  200 mg Oral BID   Continuous: . sodium chloride 75 mL/hr at 02/12/15 1821   [id number], albuterol, ondansetron **OR** ondansetron (ZOFRAN) IV  Assesment: He has COPD exacerbation and acute on chronic respiratory failure. He has community-acquired pneumonia. At baseline he requires oxygen at home but has continued to smoke at home and he says the reason he has continued to smoke is because of his presumed diagnosis of lung cancer with short lifespan. He is generally improving at this point but I think he is going to require high flow oxygen concentrator at home. Active Problems:    COPD exacerbation ([place])   Hypernatremia   Acute on chronic respiratory failure with hypoxia and hypercapnia (HCC)   CAP (community acquired pneumonia)   Thrombocytopenia ([place])   HTN (hypertension)    Plan: Try to get the records from [place]. I don't know that that will change his treatment plan now. I think he is approaching his baseline but will need high flow oxygen at home. He obviously needs to stop smoking    LOS: 6 days   Gardner Servantes L 02/13/2015, 8:43 AM

## 2015-02-13 NOTE — Progress Notes (Signed)
Pt needs to sit in a chair and have more mobility

## 2015-02-14 ENCOUNTER — Inpatient Hospital Stay (HOSPITAL_COMMUNITY): Payer: Medicare Other

## 2015-02-14 LAB — BASIC METABOLIC PANEL
Anion gap: 3 — ABNORMAL LOW (ref 5–15)
BUN: 34 mg/dL — ABNORMAL HIGH (ref 6–20)
CHLORIDE: 95 mmol/L — AB (ref 101–111)
CO2: 42 mmol/L — ABNORMAL HIGH (ref 22–32)
Calcium: 8.1 mg/dL — ABNORMAL LOW (ref 8.9–10.3)
Creatinine, Ser: 0.6 mg/dL — ABNORMAL LOW (ref 0.61–1.24)
GFR calc Af Amer: >60 mL/min (ref >60)
GLUCOSE: 153 mg/dL — AB (ref 65–99)
POTASSIUM: 4.4 mmol/L (ref 3.5–5.1)
SODIUM: 140 mmol/L (ref 135–145)

## 2015-02-14 LAB — CBC
HEMATOCRIT: 42.4 % (ref 39.0–52.0)
HEMOGLOBIN: 13.4 g/dL (ref 13.0–17.0)
MCH: 33 pg (ref 26.0–34.0)
MCHC: 31.6 g/dL (ref 30.0–36.0)
MCV: 104.4 fL — ABNORMAL HIGH (ref 78.0–100.0)
Platelets: 143 10*3/uL — ABNORMAL LOW (ref 150–400)
RBC: 4.06 MIL/uL — ABNORMAL LOW (ref 4.22–5.81)
RDW: 13.5 % (ref 11.5–15.5)
WBC: 12.3 10*3/uL — AB (ref 4.0–10.5)

## 2015-02-14 MED ORDER — FUROSEMIDE 40 MG PO TABS
40.0000 mg | ORAL_TABLET | Freq: Every day | ORAL | Status: DC
  Administered 2015-02-15 – 2015-02-18 (×4): 40 mg via ORAL
  Filled 2015-02-14 (×4): qty 1

## 2015-02-14 MED ORDER — FUROSEMIDE 20 MG PO TABS
20.0000 mg | ORAL_TABLET | Freq: Once | ORAL | Status: AC
Start: 2015-02-14 — End: 2015-02-14
  Administered 2015-02-14: 20 mg via ORAL
  Filled 2015-02-14: qty 1

## 2015-02-14 MED ORDER — THEOPHYLLINE ER 400 MG PO TB24
400.0000 mg | ORAL_TABLET | Freq: Every day | ORAL | Status: DC
  Administered 2015-02-14 – 2015-02-18 (×5): 400 mg via ORAL
  Filled 2015-02-14 (×7): qty 1

## 2015-02-14 NOTE — Care Management Note (Signed)
Case Management Note  Patient Details  Name: Dominic Martinez MRN: [id number] Date of Birth: 16-Jan-1946   Expected Discharge Date:  02/11/15               Expected Discharge Plan:  Home/Self Care  In-House Referral:  NA  Discharge planning Services  CM Consult  Post Acute Care Choice:  NA Choice offered to:  NA  DME Arranged:    DME Agency:     HH Arranged:    [place] Agency:     Status of Service:  Completed, signed off  Medicare Important Message Given:    Date Medicare IM Given:    Medicare IM give by:    Date Additional Medicare IM Given:    Additional Medicare Important Message give by:     If discussed at [place] of Stay Meetings, dates discussed:  02/14/2015  Additional Comments: Cont to wean pt's O2.   Sherald Barge, RN 02/14/2015, 2:51 PM

## 2015-02-14 NOTE — Progress Notes (Signed)
Subjective: He says he feels about the same. He is still having high flow oxygen and he is now on a oxygen mask.  Objective: Vital signs in last 24 hours: Temp:  [97.7 F (36.5 C)-98.2 F (36.8 C)] 97.9 F (36.6 C) (12/22 0500) Pulse Rate:  [8-88] 8 (12/22 0715) Resp:  [20] 20 (12/22 0500) BP: (140-159)/(75-78) 159/75 mmHg (12/22 0500) SpO2:  [77 %-98 %] 88 % (12/22 0715) FiO2 (%):  [55 %] 55 % (12/22 0715) Weight change:  Last BM Date: 02/12/15  Intake/Output from previous day: 12/21 0701 - 12/22 0700 In: 240 [P.O.:240] Out: 525 [Urine:525]  PHYSICAL EXAM General appearance: alert, cooperative and mild distress Resp: rhonchi bilaterally Cardio: regular rate and rhythm, S1, S2 normal, no murmur, click, rub or gallop GI: soft, non-tender; bowel sounds normal; no masses,  no organomegaly Extremities: extremities normal, atraumatic, no cyanosis or edema  Lab Results:  Results for orders placed or performed during the hospital encounter of 02/07/15 (from the past 48 hour(s))  Blood gas, arterial     Status: Abnormal   Collection Time: 02/12/15  3:10 PM  Result Value Ref Range   O2 Content 10.0 L/min   Delivery systems NASAL CANNULA    pH, Arterial 7.336 (L) 7.350 - 7.450   pCO2 arterial 77.8 (HH) 35.0 - 45.0 mmHg    Comment: CRITICAL RESULT CALLED TO, READ BACK BY AND VERIFIED WITH: JASON VAUGHN,RN AT 1521, BY WENDY VIA,RRT,RCP, ON 02/12/2015    pO2, Arterial 43.6 (L) 80.0 - 100.0 mmHg   Bicarbonate 34.8 (H) 20.0 - 24.0 mEq/L   Acid-Base Excess 14.1 (H) 0.0 - 2.0 mmol/L   O2 Saturation 74.6 %   Patient temperature 37.0    Collection site RIGHT RADIAL    Drawn by [id number]    Sample type ARTERIAL DRAW    Allens test (pass/fail) PASS PASS  Basic metabolic panel     Status: Abnormal   Collection Time: 02/14/15  5:50 AM  Result Value Ref Range   Sodium 140 135 - 145 mmol/L   Potassium 4.4 3.5 - 5.1 mmol/L   Chloride 95 (L) 101 - 111 mmol/L   CO2 42 (H) 22 - 32 mmol/L    Glucose, Bld 153 (H) 65 - 99 mg/dL   BUN 34 (H) 6 - 20 mg/dL   Creatinine, Ser 0.60 (L) 0.61 - 1.24 mg/dL   Calcium 8.1 (L) 8.9 - 10.3 mg/dL   GFR calc non Af Amer >60 >60 mL/min   GFR calc Af Amer >60 >60 mL/min    Comment: (NOTE) The eGFR has been calculated using the CKD EPI equation. This calculation has not been validated in all clinical situations. eGFR's persistently <60 mL/min signify possible Chronic Kidney Disease.    Anion gap 3 (L) 5 - 15  CBC     Status: Abnormal   Collection Time: 02/14/15  5:50 AM  Result Value Ref Range   WBC 12.3 (H) 4.0 - 10.5 K/uL   RBC 4.06 (L) 4.22 - 5.81 MIL/uL   Hemoglobin 13.4 13.0 - 17.0 g/dL   HCT 42.4 39.0 - 52.0 %   MCV 104.4 (H) 78.0 - 100.0 fL   MCH 33.0 26.0 - 34.0 pg   MCHC 31.6 30.0 - 36.0 g/dL   RDW 13.5 11.5 - 15.5 %   Platelets 143 (L) 150 - 400 K/uL    ABGS  Recent Labs  02/12/15 1510  PHART 7.336*  PO2ART 43.6*  HCO3 34.8*   CULTURES Recent Results (  from the past 240 hour(s))  MRSA PCR Screening     Status: None   Collection Time: 02/07/15 11:14 AM  Result Value Ref Range Status   MRSA by PCR NEGATIVE NEGATIVE Final    Comment:        The GeneXpert MRSA Assay (FDA approved for NASAL specimens only), is one component of a comprehensive MRSA colonization surveillance program. It is not intended to diagnose MRSA infection nor to guide or monitor treatment for MRSA infections.   Culture, blood (Routine X 2) w Reflex to ID Panel     Status: None   Collection Time: 02/07/15 10:10 PM  Result Value Ref Range Status   Specimen Description BLOOD LEFT ANTECUBITAL  Final   Special Requests BOTTLES DRAWN AEROBIC AND ANAEROBIC 6CC  Final   Culture NO GROWTH 5 DAYS  Final   Report Status 02/12/2015 FINAL  Final  Culture, blood (Routine X 2) w Reflex to ID Panel     Status: None   Collection Time: 02/07/15 10:15 PM  Result Value Ref Range Status   Specimen Description BLOOD RIGHT ARM  Final   Special Requests  BOTTLES DRAWN AEROBIC ONLY [place]  Final   Culture NO GROWTH 5 DAYS  Final   Report Status 02/12/2015 FINAL  Final   Studies/Results: No results found.  Medications:  Prior to Admission:  Prescriptions prior to admission  Medication Sig Dispense Refill Last Dose  . ADVAIR DISKUS 250-50 MCG/DOSE AEPB INHALE 1 PUFF BY MOUTH TWICE A DAY RINSE MOUTH AFTER USE  12 02/07/2015 at Unknown time  . albuterol (PROVENTIL HFA;VENTOLIN HFA) 108 (90 BASE) MCG/ACT inhaler Inhale 2 puffs into the lungs every 6 (six) hours as needed for wheezing. 1 Inhaler 6 unknown at Unknown time  . amLODipine (NORVASC) 5 MG tablet Take 5 mg by mouth daily.   02/07/2015 at Unknown time  . theophylline (UNIPHYL) 400 MG 24 hr tablet TAKE 1/2 TABLET BY MOUTH TWICE DAILY AS DIRECTED  7 02/07/2015 at Unknown time  . tiotropium (SPIRIVA) 18 MCG inhalation capsule Place 1 capsule (18 mcg total) into inhaler and inhale daily. 30 capsule 6 02/07/2015 at Unknown time  . acetaminophen (TYLENOL) 500 MG tablet Take 500-1,000 mg by mouth daily as needed for pain (Only used on occasion for pain).   Unknown at Unknown time  . guaiFENesin (MUCINEX) 600 MG 12 hr tablet Take 1 tablet (600 mg total) by mouth 2 (two) times daily. (Patient taking differently: Take 600 mg by mouth 2 (two) times daily as needed for cough or to loosen phlegm. )   Unknown at Unknown time  . ipratropium-albuterol (DUONEB) 0.5-2.5 (3) MG/3ML SOLN Take 3 mLs by nebulization every 6 (six) hours as needed.   Unknown at Unknown time   Scheduled: . amLODipine  5 mg Oral Daily  . amoxicillin-clavulanate  1 tablet Oral Q12H  . aspirin EC  81 mg Oral Daily  . atorvastatin  80 mg Oral q1800  . enoxaparin (LOVENOX) injection  40 mg Subcutaneous Q24H  . furosemide  20 mg Oral Daily  . guaiFENesin  1,200 mg Oral BID  . ipratropium-albuterol  3 mL Nebulization Q6H WA  . methylPREDNISolone (SOLU-MEDROL) injection  60 mg Intravenous Q6H  . 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 This is related to COPD exacerbation and community-acquired pneumonia. He is still  requiring high flow oxygen. I don't think there is really anything else to add at this point. Try to wean his oxygen again today Active Problems:   COPD exacerbation (HCC)   Hypernatremia   Acute on chronic respiratory failure with hypoxia and hypercapnia (HCC)   CAP (community acquired pneumonia)   Thrombocytopenia ([place])   HTN (hypertension)    Plan: Continue current treatments.    LOS: 7 days   Meiya Wisler L 02/14/2015, 9:25 AM

## 2015-02-14 NOTE — Progress Notes (Signed)
PROGRESS NOTE  Dominic Martinez [id number] DOB: [birth year] DOA: 02/07/2015 PCP: No primary care provider on file.  Summary:  69 year old patient with chronic respiratory failure on 2 L of oxygen related to advanced COPD who presented to the hospital with shortness of breath and was found to be in acute on chronic respiratory failure with hypercapnia. He was initially started on BiPAP therapy with improvement in his mental status as well as his respiratory status. He was subsequently weaned from BiPAP and started on high flow nasal cannula. He is receiving steroids, bronchodilators and antibiotics with slow improvement. Chest x-ray also indicated possible pneumonia so he was started on IV abx. Pulmonology is following. His progress has been slow but he continues to wean down on oxygen.    Assessment/Plan: 1. COPD exacerbation, no significant change today. 2. Acute on chronic respiratory failure with hypoxia and hypercapnia. Continues to require high flow oxygen. Appreciate pulmonology input. Unable to wean thus far. No distress. 3. CAP. CXR revealed possible infiltrate. Remains afebrile. WBC 12.3. BC are unremarkable. Repeat CXR with development of left pleural effusion, looks mild. 4. Left suprahilar mass, apparently resolved. See Dr. Corrin Parker note 07/14/14 from [place], available in [place].  5. Thrombocytopenia, mild, stable, secondary to infection. 6. Essential HTN, stable. Continue home meds.  7. Tobacco abuse, counseled on the importance of cessation.   Required more oxygen overnight, but now weaning.  O2 requirements remain high, attempt to wean again today.  Continue steroids and other treatments.    Could consider thoracentesis but effusion appears mild.   Code Status: Full DVT prophylaxis: Lovenox Family Communication: Discussed with patient who understands and has no concerns at this time. Disposition Plan: Discharge home when improved.   Murray Hodgkins, MD  [facility]  Pager [phone] If 7PM-7AM, please contact night-coverage at [email or web address], password [facility] 02/14/2015, 7:03 AM  LOS: 7 days   Consultants:  Pulmonology  Procedures:    Antibiotics:  Zithromax 12/15>>12/19  Rocephin 12/15>>12/19  Augmentin 12/19>>  HPI/Subjective: Denies any pain, feels about the same. Mild cough. Still SOB. Unable to wean.  Objective: Filed Vitals:   02/13/15 1937 02/13/15 1941 02/13/15 2154 02/14/15 0500  BP:   141/76 159/75  Pulse:   88 88  Temp:   98.2 F (36.8 C) 97.9 F (36.6 C)  TempSrc:   Oral Oral  Resp:   20 20  Height:      Weight:      SpO2: 90% 90% 94% 98%    Intake/Output Summary (Last 24 hours) at 02/14/15 0703 Last data filed at 02/13/15 1700  Gross per 24 hour  Intake    240 ml  Output    525 ml  Net   -285 ml     Filed Weights   02/09/15 0509 02/10/15 0400 02/11/15 0500  Weight: 47.1 kg (103 lb 13.4 oz) 49.6 kg (109 lb 5.6 oz) 50.1 kg (110 lb 7.2 oz)    Exam:    VSS, afebrile, not hypoxic on high flow [place] General:  Appears comfortable, calm. Sitting up, eating breakfast Cardiovascular: Regular rate and rhythm, no murmur, rub or gallop. No lower extremity edema. Telemetry: Sinus rhythm, no arrhythmias  Respiratory: No wheezes, rales or rhonchi. Decreased breath sounds, mild to moderate increased respiratory effort. Able to speak in full sentences.  Abdomen: soft, ntnd Psychiatric: grossly normal mood and affect, speech fluent and appropriate Neurologic: grossly non-focal.   New data reviewed:  BMP, unremarkable  WBC, 12.3  Platelets, stable 143.  Scheduled Meds: . amLODipine  5 mg Oral Daily  . amoxicillin-clavulanate  1 tablet Oral Q12H  . aspirin EC  81 mg Oral Daily  . atorvastatin  80 mg Oral q1800  . enoxaparin (LOVENOX) injection  40 mg Subcutaneous Q24H  . furosemide  20 mg Oral Daily  . guaiFENesin  1,200 mg Oral BID  . ipratropium-albuterol  3 mL Nebulization Q6H WA  .  methylPREDNISolone (SOLU-MEDROL) injection  60 mg Intravenous Q6H  . theophylline  200 mg Oral BID   Continuous Infusions:    Active Problems:   COPD exacerbation ([place])   Hypernatremia   Acute on chronic respiratory failure with hypoxia and hypercapnia ([place])   CAP (community acquired pneumonia)   Thrombocytopenia ([place])   HTN (hypertension)   Time spent: 25 minutes  By signing my name below, I, Rosalie Doctor attest that this documentation has been prepared under the direction and in the presence of Murray Hodgkins, MD Electronically signed: Rosalie Doctor, Scribe.  02/14/2015 9:40am  I personally performed the services described in this documentation. All medical record entries made by the scribe were at my direction. I have reviewed the chart and agree that the record reflects my personal performance and is accurate and complete. Murray Hodgkins, MD

## 2015-02-15 ENCOUNTER — Inpatient Hospital Stay (HOSPITAL_COMMUNITY): Payer: Medicare Other

## 2015-02-15 DIAGNOSIS — J948 Other specified pleural conditions: Secondary | ICD-10-CM

## 2015-02-15 LAB — BODY FLUID CELL COUNT WITH DIFFERENTIAL
Eos, Fluid: 0 %
LYMPHS FL: 9 %
Monocyte-Macrophage-Serous Fluid: 34 % — ABNORMAL LOW (ref 50–90)
Neutrophil Count, Fluid: 57 % — ABNORMAL HIGH (ref 0–25)
WBC FLUID: 222 uL (ref 0–1000)

## 2015-02-15 LAB — GRAM STAIN

## 2015-02-15 MED ORDER — METHYLPREDNISOLONE SODIUM SUCC 125 MG IJ SOLR
60.0000 mg | Freq: Two times a day (BID) | INTRAMUSCULAR | Status: DC
  Administered 2015-02-16 – 2015-02-18 (×5): 60 mg via INTRAVENOUS
  Filled 2015-02-15 (×5): qty 2

## 2015-02-15 NOTE — Progress Notes (Signed)
Subjective: He says he feels okay. He is still short of breath and coughing. He is back up on 15 L oxygen now and we will try to wean that again.  Objective: Vital signs in last 24 hours: Temp:  [97.9 F (36.6 C)-98.2 F (36.8 C)] 98.2 F (36.8 C) (12/23 0500) Pulse Rate:  [85-94] 91 (12/23 0500) Resp:  [20] 20 (12/23 0500) BP: (134-148)/(77-85) 139/85 mmHg (12/23 0500) SpO2:  [86 %-100 %] 86 % (12/23 0728) Weight change:  Last BM Date: 02/12/15  Intake/Output from previous day: 12/22 0701 - 12/23 0700 In: 240 [P.O.:240] Out: 1400 [Urine:1400]  PHYSICAL EXAM General appearance: alert, cooperative and no distress Resp: rhonchi bilaterally Cardio: regular rate and rhythm, S1, S2 normal, no murmur, click, rub or gallop GI: soft, non-tender; bowel sounds normal; no masses,  no organomegaly Extremities: extremities normal, atraumatic, no cyanosis or edema  Lab Results:  Results for orders placed or performed during the hospital encounter of 02/07/15 (from the past 48 hour(s))  Basic metabolic panel     Status: Abnormal   Collection Time: 02/14/15  5:50 AM  Result Value Ref Range   Sodium 140 135 - 145 mmol/L   Potassium 4.4 3.5 - 5.1 mmol/L   Chloride 95 (L) 101 - 111 mmol/L   CO2 42 (H) 22 - 32 mmol/L   Glucose, Bld 153 (H) 65 - 99 mg/dL   BUN 34 (H) 6 - 20 mg/dL   Creatinine, Ser 0.60 (L) 0.61 - 1.24 mg/dL   Calcium 8.1 (L) 8.9 - 10.3 mg/dL   GFR calc non Af Amer >60 >60 mL/min   GFR calc Af Amer >60 >60 mL/min    Comment: (NOTE) The eGFR has been calculated using the CKD EPI equation. This calculation has not been validated in all clinical situations. eGFR's persistently <60 mL/min signify possible Chronic Kidney Disease.    Anion gap 3 (L) 5 - 15  CBC     Status: Abnormal   Collection Time: 02/14/15  5:50 AM  Result Value Ref Range   WBC 12.3 (H) 4.0 - 10.5 K/uL   RBC 4.06 (L) 4.22 - 5.81 MIL/uL   Hemoglobin 13.4 13.0 - 17.0 g/dL   HCT 42.4 39.0 - 52.0 %   MCV  104.4 (H) 78.0 - 100.0 fL   MCH 33.0 26.0 - 34.0 pg   MCHC 31.6 30.0 - 36.0 g/dL   RDW 13.5 11.5 - 15.5 %   Platelets 143 (L) 150 - 400 K/uL    ABGS  Recent Labs  02/12/15 1510  PHART 7.336*  PO2ART 43.6*  HCO3 34.8*   CULTURES Recent Results (from the past 240 hour(s))  MRSA PCR Screening     Status: None   Collection Time: 02/07/15 11:14 AM  Result Value Ref Range Status   MRSA by PCR NEGATIVE NEGATIVE Final    Comment:        The GeneXpert MRSA Assay (FDA approved for NASAL specimens only), is one component of a comprehensive MRSA colonization surveillance program. It is not intended to diagnose MRSA infection nor to guide or monitor treatment for MRSA infections.   Culture, blood (Routine X 2) w Reflex to ID Panel     Status: None   Collection Time: 02/07/15 10:10 PM  Result Value Ref Range Status   Specimen Description BLOOD LEFT ANTECUBITAL  Final   Special Requests BOTTLES DRAWN AEROBIC AND ANAEROBIC 6CC  Final   Culture NO GROWTH 5 DAYS  Final   Report  Status 02/12/2015 FINAL  Final  Culture, blood (Routine X 2) w Reflex to ID Panel     Status: None   Collection Time: 02/07/15 10:15 PM  Result Value Ref Range Status   Specimen Description BLOOD RIGHT ARM  Final   Special Requests BOTTLES DRAWN AEROBIC ONLY [place]  Final   Culture NO GROWTH 5 DAYS  Final   Report Status 02/12/2015 FINAL  Final   Studies/Results: Dg Chest 2 View  02/14/2015  CLINICAL DATA:  Pneumonia and shortness of breath. EXAM: CHEST  2 VIEW COMPARISON:  02/07/2015 FINDINGS: Increased densities at both lung bases are suggestive for pleural effusions, left side greater than right. Patient rotated towards the left on the AP view which limits evaluation of the cardiac silhouette and the left upper lobe. No significant airspace disease in the right lung. Coarse lung markings related to underlying emphysema. IMPRESSION: Development of bilateral pleural effusions, left side greater than right.  Limited evaluation for airspace disease or consolidation in the left lung as described. Electronically Signed   By: Markus Daft M.D.   On: 02/14/2015 13:52    Medications:  Prior to Admission:  Prescriptions prior to admission  Medication Sig Dispense Refill Last Dose  . ADVAIR DISKUS 250-50 MCG/DOSE AEPB INHALE 1 PUFF BY MOUTH TWICE A DAY RINSE MOUTH AFTER USE  12 02/07/2015 at Unknown time  . albuterol (PROVENTIL HFA;VENTOLIN HFA) 108 (90 BASE) MCG/ACT inhaler Inhale 2 puffs into the lungs every 6 (six) hours as needed for wheezing. 1 Inhaler 6 unknown at Unknown time  . amLODipine (NORVASC) 5 MG tablet Take 5 mg by mouth daily.   02/07/2015 at Unknown time  . theophylline (UNIPHYL) 400 MG 24 hr tablet TAKE 1/2 TABLET BY MOUTH TWICE DAILY AS DIRECTED  7 02/07/2015 at Unknown time  . tiotropium (SPIRIVA) 18 MCG inhalation capsule Place 1 capsule (18 mcg total) into inhaler and inhale daily. 30 capsule 6 02/07/2015 at Unknown time  . acetaminophen (TYLENOL) 500 MG tablet Take 500-1,000 mg by mouth daily as needed for pain (Only used on occasion for pain).   Unknown at Unknown time  . guaiFENesin (MUCINEX) 600 MG 12 hr tablet Take 1 tablet (600 mg total) by mouth 2 (two) times daily. (Patient taking differently: Take 600 mg by mouth 2 (two) times daily as needed for cough or to loosen phlegm. )   Unknown at Unknown time  . ipratropium-albuterol (DUONEB) 0.5-2.5 (3) MG/3ML SOLN Take 3 mLs by nebulization every 6 (six) hours as needed.   Unknown at Unknown time   Scheduled: . amLODipine  5 mg Oral Daily  . amoxicillin-clavulanate  1 tablet Oral Q12H  . aspirin EC  81 mg Oral Daily  . atorvastatin  80 mg Oral q1800  . furosemide  40 mg Oral Daily  . guaiFENesin  1,200 mg Oral BID  . ipratropium-albuterol  3 mL Nebulization Q6H WA  . methylPREDNISolone (SOLU-MEDROL) injection  60 mg Intravenous Q6H  . theophylline  400 mg Oral Daily   Continuous:  DVV:[id number], albuterol, ondansetron  **OR** ondansetron (ZOFRAN) IV  Assesment: He was admitted with COPD exacerbation and acute on chronic hypoxic and hypercapnic respiratory failure. He also has community-acquired pneumonia. At home he is on 2-4 L of nasal oxygen chronically. He has required high flow oxygen here. He is slowly improving Active Problems:   COPD exacerbation (HCC)   Hypernatremia   Acute on chronic respiratory failure with hypoxia and hypercapnia (HCC)   CAP (community acquired pneumonia)  Thrombocytopenia ([place])   HTN (hypertension)    Plan: Continue current treatments.    LOS: 8 days   Shannan Garfinkel L 02/15/2015, 8:34 AM

## 2015-02-15 NOTE — Care Management Important Message (Signed)
Important Message  Patient Details  Name: Dominic Martinez MRN: [id number] Date of Birth: [birth year]   Medicare Important Message Given:  Yes    Sherald Barge, RN 02/15/2015, 3:21 PM

## 2015-02-15 NOTE — Progress Notes (Addendum)
PROGRESS NOTE  Dominic Martinez [id number] DOB: 09-13-1945 DOA: 02/07/2015 PCP: No primary care provider on file.  Summary:  69 year old patient with chronic respiratory failure on 2 L of oxygen related to advanced COPD who presented to the hospital with shortness of breath and was found to be in acute on chronic respiratory failure with hypercapnia treated with BiPAP and CAP treated with abx. Subsequently weaned to high flow nasal cannula but continues to have high oxygen requirement.  Pulmonology is following. His progress has been slow. Repeat CXR demonstrated increasing pleural effusion. Underwent thoracentesis 12/23. Plan home when stable on [place] oxygen.  Assessment/Plan: 1. COPD exacerbation, somewhat improved. 2. Acute on chronic respiratory failure with hypoxia and hypercapnia. Stable on high flow [place].  Appreciate pulmonology input.   3. CAP. Remains afebrile. BC are unremarkable.  4. Left pleural effusion s/p thoracentesis 12/23 with removal of 1045ml of fluid. Initial fluid analysis unremarkable, culture pending.   5. Left suprahilar mass, apparently resolved: see Dr. Corrin Parker note 07/14/14 from [place], available in [place].  6. Thrombocytopenia, mild, stable, secondary to infection. 7. Essential HTN, stable. Continue home meds.  8. Tobacco abuse, counseled on the importance of cessation.   Overall somewhat improved although still has high O2 requirement. Hopefully this will improve now that he has had a large volume removed with thoracentesis.  Continue abx, decrease steroids. Hopefully discharge in the next 1-2 days.   Code Status: Full DVT prophylaxis: Lovenox Family Communication: No family at bedside. Disposition Plan: Anticipate discharge within 48 hours.   Murray Hodgkins, MD  [facility]  Pager [phone] If 7PM-7AM, please contact night-coverage at [email or web address], password [facility] 02/15/2015, 6:28 AM  LOS: 8 days    Consultants:  Pulmonology  Procedures:  Thoracentesis with removal of 1032ml of fluid   Antibiotics:  Zithromax 12/15>>12/19  Rocephin 12/15>>12/19  Augmentin 12/19>>  HPI/Subjective: Feels good. Denies any nausea, vomiting or pain. Breathing fine.  Objective: Filed Vitals:   02/14/15 1412 02/14/15 2008 02/14/15 2009 02/14/15 2106  BP:    134/80  Pulse:    94  Temp:    98 F (36.7 C)  TempSrc:    Oral  Resp:      Height:      Weight:      SpO2: 94% 92% 92% 94%    Intake/Output Summary (Last 24 hours) at 02/15/15 [id number] Last data filed at 02/14/15 2012  Gross per 24 hour  Intake    240 ml  Output    600 ml  Net   -360 ml     Filed Weights   02/09/15 0509 02/10/15 0400 02/11/15 0500  Weight: 47.1 kg (103 lb 13.4 oz) 49.6 kg (109 lb 5.6 oz) 50.1 kg (110 lb 7.2 oz)    Exam:    VSS, afebrile, not hypoxic on high flow [place] General:  Appears calm and comfortable Cardiovascular: RRR, no m/r/g. No LE edema. Respiratory: CTA bilaterally, no w/r/r. Normal respiratory effort. Good air movement.  Musculoskeletal: grossly normal tone BUE/BLE Psychiatric: grossly normal mood and affect, speech fluent and appropriate Neurologic: grossly non-focal.   New data reviewed:  Pleural fluid unremarkable  CXR post procedure; shows complete resolution of left pleural effusion.  Pending data:  Fluid culture  Scheduled Meds: . amLODipine  5 mg Oral Daily  . amoxicillin-clavulanate  1 tablet Oral Q12H  . aspirin EC  81 mg Oral Daily  . atorvastatin  80 mg Oral q1800  . enoxaparin (LOVENOX) injection  40 mg Subcutaneous Q24H  .  furosemide  40 mg Oral Daily  . guaiFENesin  1,200 mg Oral BID  . ipratropium-albuterol  3 mL Nebulization Q6H WA  . methylPREDNISolone (SOLU-MEDROL) injection  60 mg Intravenous Q6H  . theophylline  400 mg Oral Daily   Continuous Infusions:    Active Problems:   COPD exacerbation ([place])   Hypernatremia   Acute on chronic respiratory  failure with hypoxia and hypercapnia ([place])   CAP (community acquired pneumonia)   Thrombocytopenia ([place])   HTN (hypertension)   Time spent: 30 minutes  By signing my name below, I, Rosalie Doctor attest that this documentation has been prepared under the direction and in the presence of Murray Hodgkins, MD Electronically signed: Rosalie Doctor, Scribe. 02/15/2015 1:20pm  I personally performed the services described in this documentation. All medical record entries made by the scribe were at my direction. I have reviewed the chart and agree that the record reflects my personal performance and is accurate and complete. Murray Hodgkins, MD

## 2015-02-15 NOTE — Progress Notes (Signed)
Thoracenetsis complete no signs of distress. 1050 ml red colored pleural fluid removed.

## 2015-02-15 NOTE — Care Management Note (Signed)
Case Management Note  Patient Details  Name: Dominic Martinez MRN: [id number] Date of Birth: Sep 11, 1945    Expected Discharge Date:  02/11/15               Expected Discharge Plan:  Home/Self Care  In-House Referral:  NA  Discharge planning Services  CM Consult  Post Acute Care Choice:  NA Choice offered to:  NA  DME Arranged:  Oxygen DME Agency:  [place]:    [facility] Agency:     Status of Service:  Completed, signed off  Medicare Important Message Given:    Date Medicare IM Given:    Medicare IM give by:    Date Additional Medicare IM Given:    Additional Medicare Important Message give by:     If discussed at [place] of Stay Meetings, dates discussed:    Additional Comments: [place] home over weekend. Pt has increase O2 requirements from prior to admission. Pt will have new order for home O2 and [facility] may need to provide more portable O2 tanks for pt to travel home and may need to upgrade pt's home concentrator. [facility] has been made aware of DC needs.   Sherald Barge, RN 02/15/2015, 1:08 PM

## 2015-02-15 NOTE — Procedures (Signed)
PreOperative Dx: LEFT pleural effusion Postoperative Dx: LEFT pleural effusion Procedure:   US guided LEFT thoracentesis Radiologist:  Thornton Papas Anesthesia:  7 ml of 1% lidocaine Specimen:  1050 ml of serosanguinous to red colored fluid EBL:   < 1 ml Complications: None

## 2015-02-16 MED ORDER — FUROSEMIDE 10 MG/ML IJ SOLN
40.0000 mg | Freq: Once | INTRAMUSCULAR | Status: AC
  Administered 2015-02-16: 40 mg via INTRAVENOUS
  Filled 2015-02-16: qty 4

## 2015-02-16 NOTE — Progress Notes (Signed)
[facility] PROGRESS NOTE  Kaion Arminio [id number] DOB: 11-24-1945 DOA: 02/07/2015 PCP: No primary care provider on file.  Assessment/Plan: 1. COPD exacerbation, very slow to improve. Continue bronchodilators, Mucinex, and steroids. Continue to wean O2 as tolerated. 2. Acute on chronic respiratory failure with hypoxia and hypercapnia. Still requiring 8L [place]. Appreciate pulmonology input. Will continue to attempt weaning.  3. CAP. Remains afebrile. BC are unremarkable. He has completed a course of abx.  4. Left pleural effusion s/p thoracentesis 12/23 with removal of 1042ml of fluid. Initial fluid analysis unremarkable, culture pending.  5. Left suprahilar mass, apparently resolved: see Dr. Corrin Parker note 07/14/14 from [facility], available in [place].  6. Thrombocytopenia, mild, stable, secondary to infection. 7. Essential HTN, stable. Continue home meds.  8. Tobacco abuse, counseled on the importance of cessation.   Code Status: Full DVT prophylaxis: Lovenox Family Communication: No family at bedside. Disposition Plan: Discharge when improved.    Consultants:  Pulmonology  Procedures:  Thoracentesis with removal of 1023ml of fluid  Antibiotics:  Zithromax 12/15>>12/19  Rocephin 12/15>>12/19  Augmentin 12/19>>12/24  HPI/Subjective: Feels fine. States his breathing feels a little better s/p the thoracentesis yesterday. Has a mild cough.   Objective: Filed Vitals:   02/16/15 0545 02/16/15 0743  BP: 153/72   Pulse: 81   Temp: 98.6 F (37 C)   Resp: 20 17    Intake/Output Summary (Last 24 hours) at 02/16/15 0826 Last data filed at 02/16/15 0600  Gross per 24 hour  Intake    960 ml  Output   1450 ml  Net   -490 ml   Filed Weights   02/09/15 0509 02/10/15 0400 02/11/15 0500  Weight: 47.1 kg (103 lb 13.4 oz) 49.6 kg (109 lb 5.6 oz) 50.1 kg (110 lb 7.2 oz)    Exam:  General: NAD, looks comfortable Cardiovascular: RRR, S1, S2  Respiratory:  Mild wheeze bilaterally. No rales or rhonchi Abdomen: soft, non tender, no distention , bowel sounds normal Musculoskeletal: 1+ edema b/l   Data Reviewed: Basic Metabolic Panel:  Recent Labs Lab 02/10/15 0458 02/14/15 0550  NA 138 140  K 5.0 4.4  CL 93* 95*  CO2 43* 42*  GLUCOSE 154* 153*  BUN 29* 34*  CREATININE 0.58* 0.60*  CALCIUM 8.6* 8.1*  MG 1.7  --    CBC:  Recent Labs Lab 02/14/15 0550  WBC 12.3*  HGB 13.4  HCT 42.4  MCV 104.4*  PLT 143*   BNP (last 3 results)  Recent Labs  02/07/15 1634  BNP 129.0*     Recent Results (from the past 240 hour(s))  MRSA PCR Screening     Status: None   Collection Time: 02/07/15 11:14 AM  Result Value Ref Range Status   MRSA by PCR NEGATIVE NEGATIVE Final    Comment:        The GeneXpert MRSA Assay (FDA approved for NASAL specimens only), is one component of a comprehensive MRSA colonization surveillance program. It is not intended to diagnose MRSA infection nor to guide or monitor treatment for MRSA infections.   Culture, blood (Routine X 2) w Reflex to ID Panel     Status: None   Collection Time: 02/07/15 10:10 PM  Result Value Ref Range Status   Specimen Description BLOOD LEFT ANTECUBITAL  Final   Special Requests BOTTLES DRAWN AEROBIC AND ANAEROBIC 6CC  Final   Culture NO GROWTH 5 DAYS  Final   Report Status 02/12/2015 FINAL  Final  Culture, blood (Routine X  2) w Reflex to ID Panel     Status: None   Collection Time: 02/07/15 10:15 PM  Result Value Ref Range Status   Specimen Description BLOOD RIGHT ARM  Final   Special Requests BOTTLES DRAWN AEROBIC ONLY 6CC  Final   Culture NO GROWTH 5 DAYS  Final   Report Status 02/12/2015 FINAL  Final  Culture, body fluid-bottle     Status: None (Preliminary result)   Collection Time: 02/15/15 10:55 AM  Result Value Ref Range Status   Specimen Description PLEURAL  Final   Special Requests BOTTLES DRAWN AEROBIC AND ANAEROBIC 8CC EACH  Final   Culture NO GROWTH <  24 HOURS  Final   Report Status PENDING  Incomplete  Gram stain     Status: None   Collection Time: 02/15/15 10:55 AM  Result Value Ref Range Status   Specimen Description PLEURAL  Final   Special Requests NONE  Final   Gram Stain WBC SEEN NO ORGANISMS SEEN  Final   Report Status 02/15/2015 FINAL  Final     Studies: Dg Chest 1 View  02/15/2015  CLINICAL DATA:  BILATERAL pleural effusions post LEFT thoracentesis EXAM: CHEST 1 VIEW COMPARISON:  02/14/2015 FINDINGS: Upper normal heart size. Atherosclerotic calcification aorta. Mediastinal contours normal vascularity normal. Near complete resolution of previously identified LEFT pleural effusion post thoracentesis. Persistent RIGHT pleural effusion and basilar atelectasis. No pneumothorax. Skin fold projects over the upper LEFT chest. Underlying emphysematous, bronchitic and chronic interstitial changes. IMPRESSION: COPD changes with persistent RIGHT pleural effusion and basilar atelectasis. Near complete resolution of LEFT pleural effusion and basilar atelectasis post thoracentesis without evidence of pneumothorax. Patient indicates slightly improved ease of breathing post thoracentesis. Electronically Signed   By: Lavonia Dana M.D.   On: 02/15/2015 11:23   Dg Chest 2 View  02/14/2015  CLINICAL DATA:  Pneumonia and shortness of breath. EXAM: CHEST  2 VIEW COMPARISON:  02/07/2015 FINDINGS: Increased densities at both lung bases are suggestive for pleural effusions, left side greater than right. Patient rotated towards the left on the AP view which limits evaluation of the cardiac silhouette and the left upper lobe. No significant airspace disease in the right lung. Coarse lung markings related to underlying emphysema. IMPRESSION: Development of bilateral pleural effusions, left side greater than right. Limited evaluation for airspace disease or consolidation in the left lung as described. Electronically Signed   By: Markus Daft M.D.   On: 02/14/2015  13:52   US Thoracentesis Asp Pleural Space W/img Guide  02/15/2015  CLINICAL DATA:  BILATERAL pleural effusions, acute respiratory failure with hypoxia, COPD exacerbation, significant O2 requirements EXAM: ULTRASOUND GUIDED DIAGNOSTIC AND THERAPEUTIC LEFT THORACENTESIS COMPARISON:  Chest radiograph 02/14/2015 PROCEDURE: Procedure, benefits, and risks of procedure were discussed with patient. Written informed consent for procedure was obtained. Time out protocol followed. Pleural effusion localized by ultrasound at the posterior LEFT hemithorax. Skin prepped and draped in usual sterile fashion. Skin and soft tissues anesthetized with 7 mL of 1% lidocaine. 8 French thoracentesis catheter placed into the LEFT pleural space. 1050 mL of thin serosanguineous to red colored fluid aspirated by syringe pump. Procedure tolerated well by patient without immediate complication. COMPLICATIONS: None FINDINGS: A total of approximately 1050 mL of LEFT pleural fluid was removed. A fluid sample of 180 mL was sent for laboratory analysis. IMPRESSION: Successful ultrasound guided LEFT thoracentesis yielding 1050 mL of pleural fluid. Electronically Signed   By: Lavonia Dana M.D.   On: 02/15/2015 12:31  Scheduled Meds: . amLODipine  5 mg Oral Daily  . amoxicillin-clavulanate  1 tablet Oral Q12H  . aspirin EC  81 mg Oral Daily  . atorvastatin  80 mg Oral q1800  . furosemide  40 mg Oral Daily  . guaiFENesin  1,200 mg Oral BID  . ipratropium-albuterol  3 mL Nebulization Q6H WA  . methylPREDNISolone (SOLU-MEDROL) injection  60 mg Intravenous Q12H  . theophylline  400 mg Oral Daily   Continuous Infusions:   Active Problems:   COPD exacerbation (HCC)   Hypernatremia   Acute on chronic respiratory failure with hypoxia and hypercapnia (HCC)   CAP (community acquired pneumonia)   Thrombocytopenia ([place])   HTN (hypertension)    Time spent: 20 minutes   [employer]. MD  [facility] Pager [phone]. If  7PM-7AM, please contact night-coverage at [email or web address], password [facility] 02/16/2015, 8:26 AM  LOS: 9 days      By signing my name below, I, Rosalie Doctor, attest that this documentation has been prepared under the direction and in the presence of [employer]. MD Electronically Signed: Rosalie Doctor, Scribe. 02/16/2015 8:31am  I, Dr. Kathie Dike, personally performed the services described in this documentaiton. All medical record entries made by the scribe were at my direction and in my presence. I have reviewed the chart and agree that the record reflects my personal performance and is accurate and complete  Kathie Dike, MD, 02/16/2015 8:42 AM

## 2015-02-16 NOTE — Progress Notes (Signed)
He says he feels better. He is still requiring high flow oxygen but has been able to get down to 8 L. He has no new complaints.  Exam shows he is awake and alert. He is on oxygen. His chest shows rhonchi bilaterally. His heart is regular.  He has COPD exacerbation and has been very slow to improve. He has chronic hypoxic respiratory failure with acute exacerbation and still requiring 8 L nasal cannula.  Continue treatments. I think when he is discharged home he is going to require a high flow nasal cannula at least short-term

## 2015-02-17 LAB — BASIC METABOLIC PANEL
Anion gap: 4 — ABNORMAL LOW (ref 5–15)
BUN: 33 mg/dL — AB (ref 6–20)
CALCIUM: 7.9 mg/dL — AB (ref 8.9–10.3)
CHLORIDE: 86 mmol/L — AB (ref 101–111)
CO2: 49 mmol/L — AB (ref 22–32)
CREATININE: 0.57 mg/dL — AB (ref 0.61–1.24)
GFR calc non Af Amer: >60 mL/min (ref >60)
GLUCOSE: 129 mg/dL — AB (ref 65–99)
Potassium: 3.8 mmol/L (ref 3.5–5.1)
Sodium: 139 mmol/L (ref 135–145)

## 2015-02-17 MED ORDER — ENOXAPARIN SODIUM 40 MG/0.4ML ~~LOC~~ SOLN
40.0000 mg | SUBCUTANEOUS | Status: DC
  Administered 2015-02-17: 40 mg via SUBCUTANEOUS
  Filled 2015-02-17 (×2): qty 0.4

## 2015-02-17 NOTE — Progress Notes (Signed)
[facility] PROGRESS NOTE  Dominic Martinez [id number] DOB: 08/13/1945 DOA: 02/07/2015 PCP: No primary care provider on file.  Assessment/Plan: 1. COPD exacerbation, continue to slowly to improve. Continue bronchodilators, Mucinex, and steroids. Continue to wean O2 as tolerated.  2. Acute on chronic respiratory failure with hypoxia and hypercapnia. Still requiring 8L [place], may have to discharge with HFNC for short term. Appreciate pulmonology input. [place] had been reduced to 5-6L today.  3. CAP, resolved. Remains afebrile. BC are unremarkable. He has completed a course of abx.  4. Left pleural effusion s/p thoracentesis 12/23 with removal of 1056ml of fluid. Initial fluid analysis unremarkable, culture reveals no growth to date, final results pending.  5. Left suprahilar mass, apparently resolved: see Dr. Corrin Parker note 07/14/14 from [place], available in [place].  6. Thrombocytopenia, mild, stable, secondary to infection. 7. Essential HTN, stable. Continue home meds.  8. Tobacco abuse, counseled on the importance of cessation.   Code Status: Full DVT prophylaxis: Lovenox Family Communication: No family at bedside. Discussed with patient who understands and has no concerns at this time. Disposition Plan: Anticipate discharge within 24 hours.    Consultants:  Pulmonology  Procedures:  Thoracentesis with removal of 101ml of fluid  Antibiotics:  Zithromax 12/15>>12/19  Rocephin 12/15>>12/19  Augmentin 12/19>>12/24  HPI/Subjective: Denies increasing SOB.   Objective: Filed Vitals:   02/16/15 2140 02/17/15 0524  BP: 127/70 119/78  Pulse: 94 92  Temp: 98.3 F (36.8 C) 98.3 F (36.8 C)  Resp: 18 18    Intake/Output Summary (Last 24 hours) at 02/17/15 0801 Last data filed at 02/17/15 0000  Gross per 24 hour  Intake    720 ml  Output   2000 ml  Net  -1280 ml   Filed Weights   02/09/15 0509 02/10/15 0400 02/11/15 0500  Weight: 47.1 kg (103 lb 13.4 oz)  49.6 kg (109 lb 5.6 oz) 50.1 kg (110 lb 7.2 oz)    Exam:  General: NAD, looks comfortable Cardiovascular: RRR, S1, S2  Respiratory: clear bilaterally, No wheezing, rales or rhonchi Abdomen: soft, non tender, no distention , bowel sounds normal Musculoskeletal: 1+ edema b/l   Data Reviewed: Basic Metabolic Panel:  Recent Labs Lab 02/14/15 0550  NA 140  K 4.4  CL 95*  CO2 42*  GLUCOSE 153*  BUN 34*  CREATININE 0.60*  CALCIUM 8.1*   CBC:  Recent Labs Lab 02/14/15 0550  WBC 12.3*  HGB 13.4  HCT 42.4  MCV 104.4*  PLT 143*   BNP (last 3 results)  Recent Labs  02/07/15 1634  BNP 129.0*     Recent Results (from the past 240 hour(s))  MRSA PCR Screening     Status: None   Collection Time: 02/07/15 11:14 AM  Result Value Ref Range Status   MRSA by PCR NEGATIVE NEGATIVE Final    Comment:        The GeneXpert MRSA Assay (FDA approved for NASAL specimens only), is one component of a comprehensive MRSA colonization surveillance program. It is not intended to diagnose MRSA infection nor to guide or monitor treatment for MRSA infections.   Culture, blood (Routine X 2) w Reflex to ID Panel     Status: None   Collection Time: 02/07/15 10:10 PM  Result Value Ref Range Status   Specimen Description BLOOD LEFT ANTECUBITAL  Final   Special Requests BOTTLES DRAWN AEROBIC AND ANAEROBIC [facility]  Final   Culture NO GROWTH 5 DAYS  Final   Report Status 02/12/2015 FINAL  Final  Culture, blood (Routine X 2) w Reflex to ID Panel     Status: None   Collection Time: 02/07/15 10:15 PM  Result Value Ref Range Status   Specimen Description BLOOD RIGHT ARM  Final   Special Requests BOTTLES DRAWN AEROBIC ONLY 6CC  Final   Culture NO GROWTH 5 DAYS  Final   Report Status 02/12/2015 FINAL  Final  Culture, body fluid-bottle     Status: None (Preliminary result)   Collection Time: 02/15/15 10:55 AM  Result Value Ref Range Status   Specimen Description PLEURAL  Final   Special  Requests BOTTLES DRAWN AEROBIC AND ANAEROBIC 8CC EACH  Final   Culture NO GROWTH 2 DAYS  Final   Report Status PENDING  Incomplete  Gram stain     Status: None   Collection Time: 02/15/15 10:55 AM  Result Value Ref Range Status   Specimen Description PLEURAL  Final   Special Requests NONE  Final   Gram Stain WBC SEEN NO ORGANISMS SEEN  Final   Report Status 02/15/2015 FINAL  Final     Studies: Dg Chest 1 View  02/15/2015  CLINICAL DATA:  BILATERAL pleural effusions post LEFT thoracentesis EXAM: CHEST 1 VIEW COMPARISON:  02/14/2015 FINDINGS: Upper normal heart size. Atherosclerotic calcification aorta. Mediastinal contours normal vascularity normal. Near complete resolution of previously identified LEFT pleural effusion post thoracentesis. Persistent RIGHT pleural effusion and basilar atelectasis. No pneumothorax. Skin fold projects over the upper LEFT chest. Underlying emphysematous, bronchitic and chronic interstitial changes. IMPRESSION: COPD changes with persistent RIGHT pleural effusion and basilar atelectasis. Near complete resolution of LEFT pleural effusion and basilar atelectasis post thoracentesis without evidence of pneumothorax. Patient indicates slightly improved ease of breathing post thoracentesis. Electronically Signed   By: Lavonia Dana M.D.   On: 02/15/2015 11:23   US Thoracentesis Asp Pleural Space W/img Guide  02/15/2015  CLINICAL DATA:  BILATERAL pleural effusions, acute respiratory failure with hypoxia, COPD exacerbation, significant O2 requirements EXAM: ULTRASOUND GUIDED DIAGNOSTIC AND THERAPEUTIC LEFT THORACENTESIS COMPARISON:  Chest radiograph 02/14/2015 PROCEDURE: Procedure, benefits, and risks of procedure were discussed with patient. Written informed consent for procedure was obtained. Time out protocol followed. Pleural effusion localized by ultrasound at the posterior LEFT hemithorax. Skin prepped and draped in usual sterile fashion. Skin and soft tissues anesthetized  with 7 mL of 1% lidocaine. 8 French thoracentesis catheter placed into the LEFT pleural space. 1050 mL of thin serosanguineous to red colored fluid aspirated by syringe pump. Procedure tolerated well by patient without immediate complication. COMPLICATIONS: None FINDINGS: A total of approximately 1050 mL of LEFT pleural fluid was removed. A fluid sample of 180 mL was sent for laboratory analysis. IMPRESSION: Successful ultrasound guided LEFT thoracentesis yielding 1050 mL of pleural fluid. Electronically Signed   By: Lavonia Dana M.D.   On: 02/15/2015 12:31    Scheduled Meds: . amLODipine  5 mg Oral Daily  . aspirin EC  81 mg Oral Daily  . atorvastatin  80 mg Oral q1800  . furosemide  40 mg Oral Daily  . guaiFENesin  1,200 mg Oral BID  . ipratropium-albuterol  3 mL Nebulization Q6H WA  . methylPREDNISolone (SOLU-MEDROL) injection  60 mg Intravenous Q12H  . theophylline  400 mg Oral Daily   Continuous Infusions:   Active Problems:   COPD exacerbation (HCC)   Hypernatremia   Acute on chronic respiratory failure with hypoxia and hypercapnia (HCC)   CAP (community acquired pneumonia)   Thrombocytopenia ([place])   HTN (  hypertension)    Time spent: 20 minutes   [employer]. MD  [facility] Pager [phone]. If 7PM-7AM, please contact night-coverage at [email or web address], password [facility] 02/17/2015, 8:01 AM  LOS: 10 days     By signing my name below, I, Rennis Harding, attest that this documentation has been prepared under the direction and in the presence of Kathie Dike, MD. Electronically signed: Rennis Harding, Scribe. 02/17/2015 9:18am   I, Dr. Kathie Dike, personally performed the services described in this documentaiton. All medical record entries made by the scribe were at my direction and in my presence. I have reviewed the chart and agree that the record reflects my personal performance and is accurate and complete  Kathie Dike, MD, 02/17/2015 10:01 AM

## 2015-02-17 NOTE — Progress Notes (Signed)
Subjective: He says he feels better. He is being able to to have his oxygen reduced and seems to be tolerating it okay  Objective: Vital signs in last 24 hours: Temp:  [98.3 F (36.8 C)-98.4 F (36.9 C)] 98.3 F (36.8 C) (12/25 0524) Pulse Rate:  [92-94] 92 (12/25 0524) Resp:  [18] 18 (12/25 0524) BP: (119-148)/(70-78) 119/78 mmHg (12/25 0524) SpO2:  [90 %-97 %] 96 % (12/25 0830) Weight change:  Last BM Date: 02/12/15  Intake/Output from previous day: 12/24 0701 - 12/25 0700 In: 720 [P.O.:720] Out: 2000 [Urine:2000]  PHYSICAL EXAM General appearance: alert, cooperative and mild distress Resp: rhonchi bilaterally Cardio: regular rate and rhythm, S1, S2 normal, no murmur, click, rub or gallop GI: soft, non-tender; bowel sounds normal; no masses,  no organomegaly Extremities: extremities normal, atraumatic, no cyanosis or edema  Lab Results:  Results for orders placed or performed during the hospital encounter of 02/07/15 (from the past 48 hour(s))  Body fluid cell count with differential     Status: Abnormal   Collection Time: 02/15/15 10:55 AM  Result Value Ref Range   Fluid Type-FCT Pleural, L    Color, Fluid ORANGE YELLOW   Appearance, Fluid CLOUDY CLEAR   WBC, Fluid 222 0 - 1000 cu mm   Neutrophil Count, Fluid 57 (H) 0 - 25 %   Lymphs, Fluid 9 %   Monocyte-Macrophage-Serous Fluid 34 (L) 50 - 90 %   Eos, Fluid 0 %   Other Cells, Fluid MANY MESOTHELIAL CELLS %  Culture, body fluid-bottle     Status: None (Preliminary result)   Collection Time: 02/15/15 10:55 AM  Result Value Ref Range   Specimen Description PLEURAL    Special Requests BOTTLES DRAWN AEROBIC AND ANAEROBIC 8CC EACH    Culture NO GROWTH 2 DAYS    Report Status PENDING   Gram stain     Status: None   Collection Time: 02/15/15 10:55 AM  Result Value Ref Range   Specimen Description PLEURAL    Special Requests NONE    Gram Stain WBC SEEN NO ORGANISMS SEEN    Report Status 02/15/2015 FINAL   Basic  metabolic panel     Status: Abnormal   Collection Time: 02/17/15  7:12 AM  Result Value Ref Range   Sodium 139 135 - 145 mmol/L   Potassium 3.8 3.5 - 5.1 mmol/L   Chloride 86 (L) 101 - 111 mmol/L   CO2 49 (H) 22 - 32 mmol/L   Glucose, Bld 129 (H) 65 - 99 mg/dL   BUN 33 (H) 6 - 20 mg/dL   Creatinine, Ser 0.57 (L) 0.61 - 1.24 mg/dL   Calcium 7.9 (L) 8.9 - 10.3 mg/dL   GFR calc non Af Amer >60 >60 mL/min   GFR calc Af Amer >60 >60 mL/min    Comment: (NOTE) The eGFR has been calculated using the CKD EPI equation. This calculation has not been validated in all clinical situations. eGFR's persistently <60 mL/min signify possible Chronic Kidney Disease.    Anion gap 4 (L) 5 - 15    ABGS No results for input(s): PHART, PO2ART, TCO2, HCO3 in the last 72 hours.  Invalid input(s): PCO2 CULTURES Recent Results (from the past 240 hour(s))  MRSA PCR Screening     Status: None   Collection Time: 02/07/15 11:14 AM  Result Value Ref Range Status   MRSA by PCR NEGATIVE NEGATIVE Final    Comment:        The GeneXpert MRSA Assay (  FDA approved for NASAL specimens only), is one component of a comprehensive MRSA colonization surveillance program. It is not intended to diagnose MRSA infection nor to guide or monitor treatment for MRSA infections.   Culture, blood (Routine X 2) w Reflex to ID Panel     Status: None   Collection Time: 02/07/15 10:10 PM  Result Value Ref Range Status   Specimen Description BLOOD LEFT ANTECUBITAL  Final   Special Requests BOTTLES DRAWN AEROBIC AND ANAEROBIC 6CC  Final   Culture NO GROWTH 5 DAYS  Final   Report Status 02/12/2015 FINAL  Final  Culture, blood (Routine X 2) w Reflex to ID Panel     Status: None   Collection Time: 02/07/15 10:15 PM  Result Value Ref Range Status   Specimen Description BLOOD RIGHT ARM  Final   Special Requests BOTTLES DRAWN AEROBIC ONLY 6CC  Final   Culture NO GROWTH 5 DAYS  Final   Report Status 02/12/2015 FINAL  Final   Culture, body fluid-bottle     Status: None (Preliminary result)   Collection Time: 02/15/15 10:55 AM  Result Value Ref Range Status   Specimen Description PLEURAL  Final   Special Requests BOTTLES DRAWN AEROBIC AND ANAEROBIC [place]  Final   Culture NO GROWTH 2 DAYS  Final   Report Status PENDING  Incomplete  Gram stain     Status: None   Collection Time: 02/15/15 10:55 AM  Result Value Ref Range Status   Specimen Description PLEURAL  Final   Special Requests NONE  Final   Gram Stain WBC SEEN NO ORGANISMS SEEN  Final   Report Status 02/15/2015 FINAL  Final   Studies/Results: Dg Chest 1 View  02/15/2015  CLINICAL DATA:  BILATERAL pleural effusions post LEFT thoracentesis EXAM: CHEST 1 VIEW COMPARISON:  02/14/2015 FINDINGS: Upper normal heart size. Atherosclerotic calcification aorta. Mediastinal contours normal vascularity normal. Near complete resolution of previously identified LEFT pleural effusion post thoracentesis. Persistent RIGHT pleural effusion and basilar atelectasis. No pneumothorax. Skin fold projects over the upper LEFT chest. Underlying emphysematous, bronchitic and chronic interstitial changes. IMPRESSION: COPD changes with persistent RIGHT pleural effusion and basilar atelectasis. Near complete resolution of LEFT pleural effusion and basilar atelectasis post thoracentesis without evidence of pneumothorax. Patient indicates slightly improved ease of breathing post thoracentesis. Electronically Signed   By: Lavonia Dana M.D.   On: 02/15/2015 11:23   US Thoracentesis Asp Pleural Space W/img Guide  02/15/2015  CLINICAL DATA:  BILATERAL pleural effusions, acute respiratory failure with hypoxia, COPD exacerbation, significant O2 requirements EXAM: ULTRASOUND GUIDED DIAGNOSTIC AND THERAPEUTIC LEFT THORACENTESIS COMPARISON:  Chest radiograph 02/14/2015 PROCEDURE: Procedure, benefits, and risks of procedure were discussed with patient. Written informed consent for procedure was  obtained. Time out protocol followed. Pleural effusion localized by ultrasound at the posterior LEFT hemithorax. Skin prepped and draped in usual sterile fashion. Skin and soft tissues anesthetized with 7 mL of 1% lidocaine. 8 French thoracentesis catheter placed into the LEFT pleural space. 1050 mL of thin serosanguineous to red colored fluid aspirated by syringe pump. Procedure tolerated well by patient without immediate complication. COMPLICATIONS: None FINDINGS: A total of approximately 1050 mL of LEFT pleural fluid was removed. A fluid sample of 180 mL was sent for laboratory analysis. IMPRESSION: Successful ultrasound guided LEFT thoracentesis yielding 1050 mL of pleural fluid. Electronically Signed   By: Lavonia Dana M.D.   On: 02/15/2015 12:31    Medications:  Prior to Admission:  Prescriptions prior to admission  Medication Sig Dispense Refill Last Dose  . ADVAIR DISKUS 250-50 MCG/DOSE AEPB INHALE 1 PUFF BY MOUTH TWICE A DAY RINSE MOUTH AFTER USE  12 02/07/2015 at Unknown time  . albuterol (PROVENTIL HFA;VENTOLIN HFA) 108 (90 BASE) MCG/ACT inhaler Inhale 2 puffs into the lungs every 6 (six) hours as needed for wheezing. 1 Inhaler 6 unknown at Unknown time  . amLODipine (NORVASC) 5 MG tablet Take 5 mg by mouth daily.   02/07/2015 at Unknown time  . theophylline (UNIPHYL) 400 MG 24 hr tablet TAKE 1/2 TABLET BY MOUTH TWICE DAILY AS DIRECTED  7 02/07/2015 at Unknown time  . tiotropium (SPIRIVA) 18 MCG inhalation capsule Place 1 capsule (18 mcg total) into inhaler and inhale daily. 30 capsule 6 02/07/2015 at Unknown time  . acetaminophen (TYLENOL) 500 MG tablet Take 500-1,000 mg by mouth daily as needed for pain (Only used on occasion for pain).   Unknown at Unknown time  . guaiFENesin (MUCINEX) 600 MG 12 hr tablet Take 1 tablet (600 mg total) by mouth 2 (two) times daily. (Patient taking differently: Take 600 mg by mouth 2 (two) times daily as needed for cough or to loosen phlegm. )   Unknown at  Unknown time  . ipratropium-albuterol (DUONEB) 0.5-2.5 (3) MG/3ML SOLN Take 3 mLs by nebulization every 6 (six) hours as needed.   Unknown at Unknown time   Scheduled: . amLODipine  5 mg Oral Daily  . aspirin EC  81 mg Oral Daily  . atorvastatin  80 mg Oral q1800  . furosemide  40 mg Oral Daily  . guaiFENesin  1,200 mg Oral BID  . ipratropium-albuterol  3 mL Nebulization Q6H WA  . methylPREDNISolone (SOLU-MEDROL) injection  60 mg Intravenous Q12H  . theophylline  400 mg Oral Daily   Continuous:  TMA:[id number], albuterol, ondansetron **OR** ondansetron (ZOFRAN) IV  Assesment: He was admitted with COPD exacerbation and community-acquired pneumonia and a left pleural effusion. He is improving. He has acute on chronic hypoxic respiratory failure with hypercapnia also. He has required much higher flow of oxygen but is doing okay now. I think he's probably approaching baseline except he is still requiring more oxygen Active Problems:   COPD exacerbation (HCC)   Hypernatremia   Acute on chronic respiratory failure with hypoxia and hypercapnia (HCC)   CAP (community acquired pneumonia)   Thrombocytopenia (HCC)   HTN (hypertension)    Plan: Continue treatments. Plans are for him to probably be discharged home tomorrow. I will not be in tomorrow or the next day and I will plan to sign off at this point    LOS: 10 days   Enos Muhl L 02/17/2015, 9:54 AM

## 2015-02-18 MED ORDER — FUROSEMIDE 40 MG PO TABS: 40.0000 mg | ORAL_TABLET | Freq: Every day | ORAL | Status: AC | PRN

## 2015-02-18 MED ORDER — ATORVASTATIN CALCIUM 80 MG PO TABS: 80.0000 mg | ORAL_TABLET | Freq: Every day | ORAL | Status: AC

## 2015-02-18 MED ORDER — ASPIRIN 81 MG PO TBEC: 81.0000 mg | DELAYED_RELEASE_TABLET | Freq: Every day | ORAL | Status: DC

## 2015-02-18 MED ORDER — PREDNISONE 10 MG PO TABS: ORAL_TABLET | ORAL | Status: DC

## 2015-02-18 NOTE — Care Management Note (Signed)
Case Management Note  Patient Details  Name: Dominic Martinez MRN: [id number] Date of Birth: [birth year]  Expected Discharge Date:  02/11/15               Expected Discharge Plan:  Home/Self Care  In-House Referral:  NA  Discharge planning Services  CM Consult  Post Acute Care Choice:  NA Choice offered to:  NA  DME Arranged:  Oxygen DME Agency:  [place]:    [facility] Agency:     Status of Service:  Completed, signed off  Medicare Important Message Given:  Yes Date Medicare IM Given:    Medicare IM give by:    Date Additional Medicare IM Given:    Additional Medicare Important Message give by:     If discussed at [place] of Stay Meetings, dates discussed:    Additional Comments: Pt discharging home today with self care. Pt has had home O2 order updated to reflect the 8 lpm n/c he is requiring at night. Pt on 5 lpm during the day. Pt's port tanks have been delivered to pt's room and pt's updated concentrator will be delivered once he gets home. Pt verbalizes understanding. No further CM needs.   Sherald Barge, RN 02/18/2015, 11:12 AM

## 2015-02-18 NOTE — Progress Notes (Signed)
Pt discharged via wheelchair into the care of his family via private vehicle.  VS: WNL.  Discharge instructions reviewed with pt/family.  No questions presented.  Prescription given to pt/family.

## 2015-02-18 NOTE — Discharge Summary (Signed)
Physician Discharge Summary  Dominic Martinez [id number] DOB: [birth year] DOA: 02/07/2015  PCP: No primary care provider on file.  Admit date: 02/07/2015 Discharge date: 02/18/2015  Time spent: 35 minutes  Recommendations for Outpatient Follow-up:  1. Follow up with PCP within 1-2 weeks. 2. Follow up with pulmonology within 1-2 weeks.    Discharge Diagnoses:  Active Problems:   COPD exacerbation (HCC)   Hypernatremia   Acute on chronic respiratory failure with hypoxia and hypercapnia (HCC)   CAP (community acquired pneumonia)   Thrombocytopenia (HCC)   HTN (hypertension)   Discharge Condition: Improved   Diet recommendation: heart healthy   Filed Weights   02/09/15 0509 02/10/15 0400 02/11/15 0500  Weight: 47.1 kg (103 lb 13.4 oz) 49.6 kg (109 lb 5.6 oz) 50.1 kg (110 lb 7.2 oz)    History of present illness:  69 year old male with pas medical history of COPD presented with complaints of worsening shortness of breath and cough. In the ED patient was started on BiPAP and CXR revealed scattered interstitial infiltrates edema vs infection. Admitted for further evaluation and monitoring.   Hospital Course:  Acute on chronic respiratory failure with hypoxia and hypercapnia. Patient initially admitted to the hospital with respiratory failure requiring BiPAP. Initially noted to have a markedly elevated PCO2 which did improve with BiPAP therapy. His baseline PCO2 appears to be in the 70s. He was weaned off of BiPAP and started on HFNC. Progress in hospital has been slow. He has currently been weaned down to 5-6l HFNC. He was seen by pulmonology who assisted in his care. He has now been transitioned to a prednisone taper and complete a course of abx. His respiratory failure was further complicated by left pleural effusion and he underwent thoracentesis on 12/23 with improvement in his breathing. Patient is currently stable for discharge and oxygen can be further weaned down at home. He has  requested to follow up with his primary pulmonologist in [place].   1. COPD exacerbation, improved. Was started on bronchodilators, IV abx, IV steroids, and pulmonary hygiene. On discharge provided steroid taper. See above for further details.  2. CAP, CXR revealed possible infiltrate, resolve. Patient completed a course of abx. BC were unremarkable.  3.  Left pleural effusion s/p thoracentesis 12/23 with removal of 1052ml of fluid. Initial fluid analysis unremarkable, culture revealed no growth to date, final results pending.  4. Left suprahilar mass, apparently resolved: see Dr. Corrin Parker note 07/14/14 from [place], available in [place]. 5. Thrombocytopenia, mild, stable, secondary to infection. 6. Essential HTN, stable. Continued home meds.  7. Tobacco abuse, counseled on the importance of cessation.   Procedures:  Thoracentesis with removal of 1011ml of fluid.   Consultations:  Pulmonology   Discharge Exam: Filed Vitals:   02/18/15 0647 02/18/15 0722  BP: 143/79   Pulse: 80 79  Temp: 97.9 F (36.6 C)   Resp: 18 16    General: NAD, looks comfortable Cardiovascular: RRR, S1, S2  Respiratory: clear bilaterally, No wheezing, rales or rhonchi Abdomen: soft, non tender, no distention , bowel sounds normal Musculoskeletal: Trace edema b/l  Discharge Instructions   Discharge Instructions    Diet - low sodium heart healthy    Complete by:  As directed      Increase activity slowly    Complete by:  As directed           Current Discharge Medication List    START taking these medications   Details  furosemide (LASIX) 40 MG  tablet Take 1 tablet (40 mg total) by mouth daily as needed for fluid. Qty: 30 tablet, Refills: 0    predniSONE (DELTASONE) 10 MG tablet Take 40mg  po daily for 3 days then 30mg  daily for 3 daily then 20mg  daily for 3 days then 10mg  daily for 3 days then stop Qty: 30 tablet, Refills: 0      CONTINUE these medications which have CHANGED    Details  aspirin 81 MG EC tablet Take 1 tablet (81 mg total) by mouth daily. Qty: 30 tablet    atorvastatin (LIPITOR) 80 MG tablet Take 1 tablet (80 mg total) by mouth daily at 6 PM. Qty: 30 tablet, Refills: 6      CONTINUE these medications which have NOT CHANGED   Details  ADVAIR DISKUS 250-50 MCG/DOSE AEPB INHALE 1 PUFF BY MOUTH TWICE A DAY RINSE MOUTH AFTER USE Refills: 12    albuterol (PROVENTIL HFA;VENTOLIN HFA) 108 (90 BASE) MCG/ACT inhaler Inhale 2 puffs into the lungs every 6 (six) hours as needed for wheezing. Qty: 1 Inhaler, Refills: 6    amLODipine (NORVASC) 5 MG tablet Take 5 mg by mouth daily.    theophylline (UNIPHYL) 400 MG 24 hr tablet TAKE 1/2 TABLET BY MOUTH TWICE DAILY AS DIRECTED Refills: 7    tiotropium (SPIRIVA) 18 MCG inhalation capsule Place 1 capsule (18 mcg total) into inhaler and inhale daily. Qty: 30 capsule, Refills: 6    acetaminophen (TYLENOL) 500 MG tablet Take 500-1,000 mg by mouth daily as needed for pain (Only used on occasion for pain).    guaiFENesin (MUCINEX) 600 MG 12 hr tablet Take 1 tablet (600 mg total) by mouth 2 (two) times daily.    ipratropium-albuterol (DUONEB) 0.5-2.5 (3) MG/3ML SOLN Take 3 mLs by nebulization every 6 (six) hours as needed.       No Known Allergies    The results of significant diagnostics from this hospitalization (including imaging, microbiology, ancillary and laboratory) are listed below for reference.    Significant Diagnostic Studies: Dg Chest 1 View  02/15/2015  CLINICAL DATA:  BILATERAL pleural effusions post LEFT thoracentesis EXAM: CHEST 1 VIEW COMPARISON:  02/14/2015 FINDINGS: Upper normal heart size. Atherosclerotic calcification aorta. Mediastinal contours normal vascularity normal. Near complete resolution of previously identified LEFT pleural effusion post thoracentesis. Persistent RIGHT pleural effusion and basilar atelectasis. No pneumothorax. Skin fold projects over the upper LEFT chest.  Underlying emphysematous, bronchitic and chronic interstitial changes. IMPRESSION: COPD changes with persistent RIGHT pleural effusion and basilar atelectasis. Near complete resolution of LEFT pleural effusion and basilar atelectasis post thoracentesis without evidence of pneumothorax. Patient indicates slightly improved ease of breathing post thoracentesis. Electronically Signed   By: Lavonia Dana M.D.   On: 02/15/2015 11:23   Dg Chest 2 View  02/14/2015  CLINICAL DATA:  Pneumonia and shortness of breath. EXAM: CHEST  2 VIEW COMPARISON:  02/07/2015 FINDINGS: Increased densities at both lung bases are suggestive for pleural effusions, left side greater than right. Patient rotated towards the left on the AP view which limits evaluation of the cardiac silhouette and the left upper lobe. No significant airspace disease in the right lung. Coarse lung markings related to underlying emphysema. IMPRESSION: Development of bilateral pleural effusions, left side greater than right. Limited evaluation for airspace disease or consolidation in the left lung as described. Electronically Signed   By: Markus Daft M.D.   On: 02/14/2015 13:52   Dg Chest Portable 1 View  02/07/2015  CLINICAL DATA:  Shortness of breath,  diminished breath sounds, COPD EXAM: PORTABLE CHEST 1 VIEW COMPARISON:  Portable exam 1637 hours compared to 06/20/2012 FINDINGS: Borderline enlargement of cardiac silhouette. Stable mediastinal contours with atherosclerotic calcification at arch. Emphysematous changes with diffuse interstitial prominence versus previous exam suspicious for superimposed interstitial infiltrates, question edema versus infection particularly in upper lobes. No pleural effusion or pneumothorax. Bones unremarkable. IMPRESSION: COPD changes with scattered diffuse interstitial prominence questionable interstitial infiltrates ; this could represent superimposed edema or infection. Electronically Signed   By: Lavonia Dana M.D.   On:  02/07/2015 16:50   US Thoracentesis Asp Pleural Space W/img Guide  02/15/2015  CLINICAL DATA:  BILATERAL pleural effusions, acute respiratory failure with hypoxia, COPD exacerbation, significant O2 requirements EXAM: ULTRASOUND GUIDED DIAGNOSTIC AND THERAPEUTIC LEFT THORACENTESIS COMPARISON:  Chest radiograph 02/14/2015 PROCEDURE: Procedure, benefits, and risks of procedure were discussed with patient. Written informed consent for procedure was obtained. Time out protocol followed. Pleural effusion localized by ultrasound at the posterior LEFT hemithorax. Skin prepped and draped in usual sterile fashion. Skin and soft tissues anesthetized with 7 mL of 1% lidocaine. 8 French thoracentesis catheter placed into the LEFT pleural space. 1050 mL of thin serosanguineous to red colored fluid aspirated by syringe pump. Procedure tolerated well by patient without immediate complication. COMPLICATIONS: None FINDINGS: A total of approximately 1050 mL of LEFT pleural fluid was removed. A fluid sample of 180 mL was sent for laboratory analysis. IMPRESSION: Successful ultrasound guided LEFT thoracentesis yielding 1050 mL of pleural fluid. Electronically Signed   By: Lavonia Dana M.D.   On: 02/15/2015 12:31    Microbiology: Recent Results (from the past 240 hour(s))  Culture, body fluid-bottle     Status: None (Preliminary result)   Collection Time: 02/15/15 10:55 AM  Result Value Ref Range Status   Specimen Description PLEURAL  Final   Special Requests BOTTLES DRAWN AEROBIC AND ANAEROBIC [place]  Final   Culture NO GROWTH 2 DAYS  Final   Report Status PENDING  Incomplete  Gram stain     Status: None   Collection Time: 02/15/15 10:55 AM  Result Value Ref Range Status   Specimen Description PLEURAL  Final   Special Requests NONE  Final   Gram Stain WBC SEEN NO ORGANISMS SEEN  Final   Report Status 02/15/2015 FINAL  Final     Labs: Basic Metabolic Panel:  Recent Labs Lab 02/14/15 0550 02/17/15 0712  NA  140 139  K 4.4 3.8  CL 95* 86*  CO2 42* 49*  GLUCOSE 153* 129*  BUN 34* 33*  CREATININE 0.60* 0.57*  CALCIUM 8.1* 7.9*  CBC:  Recent Labs Lab 02/14/15 0550  WBC 12.3*  HGB 13.4  HCT 42.4  MCV 104.4*  PLT 143*   CBNP: BNP (last 3 results)  Recent Labs  02/07/15 1634  BNP 129.0*      Signed:  Kathie Dike, MD  [facility] 02/18/2015, 10:12 AM   By signing my name below, I, Rennis Harding, attest that this documentation has been prepared under the direction and in the presence of Kathie Dike, MD. Electronically signed: Rennis Harding, Scribe. 02/18/2015 9:30am   I, Dr. Kathie Dike, personally performed the services described in this documentaiton. All medical record entries made by the scribe were at my direction and in my presence. I have reviewed the chart and agree that the record reflects my personal performance and is accurate and complete  Kathie Dike, MD, 02/18/2015 10:12 AM

## 2015-02-18 NOTE — Plan of Care (Signed)
Problem: Physical Regulation: Goal: Ability to maintain clinical measurements within normal limits will improve Outcome: Progressing See flowsheet.  Problem: Fluid Volume: Goal: Ability to maintain a balanced intake and output will improve Outcome: Progressing Pt using urinal; Adequate intake and output see flowsheet.  Problem: Bowel/Gastric: Goal: Will not experience complications related to bowel motility Outcome: Progressing Last bowel movement was 02/16/2015

## 2015-02-18 NOTE — Care Management Important Message (Signed)
Important Message  Patient Details  Name: Dominic Martinez MRN: [id number] Date of Birth: Aug 18, 1945   Medicare Important Message Given:  Yes    Sherald Barge, RN 02/18/2015, 9:15 AM

## 2015-02-20 LAB — CULTURE, BODY FLUID-BOTTLE

## 2015-02-20 LAB — CULTURE, BODY FLUID W GRAM STAIN -BOTTLE: Culture: NO GROWTH

## 2015-09-26 ENCOUNTER — Inpatient Hospital Stay (HOSPITAL_COMMUNITY)
Admission: EM | Admit: 2015-09-26 | Discharge: 2015-09-27 | DRG: 190 | Disposition: A | Discharge status: Home / Self Care | Payer: Medicare Other | Attending: Internal Medicine | Admitting: Internal Medicine

## 2015-09-26 ENCOUNTER — Encounter (HOSPITAL_COMMUNITY): Payer: Self-pay

## 2015-09-26 ENCOUNTER — Emergency Department (HOSPITAL_COMMUNITY): Payer: Medicare Other

## 2015-09-26 DIAGNOSIS — Z85118 Personal history of other malignant neoplasm of bronchus and lung: Secondary | ICD-10-CM

## 2015-09-26 DIAGNOSIS — J441 Chronic obstructive pulmonary disease with (acute) exacerbation: Secondary | ICD-10-CM | POA: Diagnosis present

## 2015-09-26 DIAGNOSIS — G894 Chronic pain syndrome: Secondary | ICD-10-CM | POA: Diagnosis present

## 2015-09-26 DIAGNOSIS — Z681 Body mass index (BMI) 19 or less, adult: Secondary | ICD-10-CM | POA: Diagnosis not present

## 2015-09-26 DIAGNOSIS — Z9981 Dependence on supplemental oxygen: Secondary | ICD-10-CM | POA: Diagnosis not present

## 2015-09-26 DIAGNOSIS — Z8249 Family history of ischemic heart disease and other diseases of the circulatory system: Secondary | ICD-10-CM

## 2015-09-26 DIAGNOSIS — I482 Chronic atrial fibrillation: Secondary | ICD-10-CM

## 2015-09-26 DIAGNOSIS — Z87891 Personal history of nicotine dependence: Secondary | ICD-10-CM | POA: Diagnosis not present

## 2015-09-26 DIAGNOSIS — J440 Chronic obstructive pulmonary disease with acute lower respiratory infection: Secondary | ICD-10-CM | POA: Diagnosis present

## 2015-09-26 DIAGNOSIS — R64 Cachexia: Secondary | ICD-10-CM | POA: Diagnosis present

## 2015-09-26 DIAGNOSIS — Z79891 Long term (current) use of opiate analgesic: Secondary | ICD-10-CM

## 2015-09-26 DIAGNOSIS — Z515 Encounter for palliative care: Secondary | ICD-10-CM | POA: Diagnosis present

## 2015-09-26 DIAGNOSIS — J189 Pneumonia, unspecified organism: Secondary | ICD-10-CM | POA: Diagnosis present

## 2015-09-26 DIAGNOSIS — Z7951 Long term (current) use of inhaled steroids: Secondary | ICD-10-CM

## 2015-09-26 DIAGNOSIS — Z7189 Other specified counseling: Secondary | ICD-10-CM | POA: Diagnosis not present

## 2015-09-26 DIAGNOSIS — Z66 Do not resuscitate: Secondary | ICD-10-CM | POA: Diagnosis present

## 2015-09-26 DIAGNOSIS — R0602 Shortness of breath: Secondary | ICD-10-CM

## 2015-09-26 DIAGNOSIS — Z7952 Long term (current) use of systemic steroids: Secondary | ICD-10-CM

## 2015-09-26 DIAGNOSIS — Z809 Family history of malignant neoplasm, unspecified: Secondary | ICD-10-CM

## 2015-09-26 DIAGNOSIS — J90 Pleural effusion, not elsewhere classified: Secondary | ICD-10-CM | POA: Diagnosis present

## 2015-09-26 DIAGNOSIS — Z7982 Long term (current) use of aspirin: Secondary | ICD-10-CM | POA: Diagnosis not present

## 2015-09-26 DIAGNOSIS — I4891 Unspecified atrial fibrillation: Secondary | ICD-10-CM | POA: Diagnosis present

## 2015-09-26 DIAGNOSIS — I10 Essential (primary) hypertension: Secondary | ICD-10-CM | POA: Diagnosis present

## 2015-09-26 HISTORY — DX: Essential (primary) hypertension: I10

## 2015-09-26 HISTORY — DX: Malignant (primary) neoplasm, unspecified: C80.1

## 2015-09-26 HISTORY — DX: Pleural effusion, not elsewhere classified: J90

## 2015-09-26 HISTORY — DX: Dependence on supplemental oxygen: Z99.81

## 2015-09-26 LAB — I-STAT CG4 LACTIC ACID, ED: Lactic Acid, Venous: 2.04 mmol/L (ref 0.5–1.9)

## 2015-09-26 LAB — URINALYSIS, ROUTINE W REFLEX MICROSCOPIC
Bilirubin Urine: NEGATIVE
Glucose, UA: NEGATIVE mg/dL
Hgb urine dipstick: NEGATIVE
Ketones, ur: NEGATIVE mg/dL
LEUKOCYTES UA: NEGATIVE
NITRITE: NEGATIVE
PH: 7 (ref 5.0–8.0)
Protein, ur: 30 mg/dL — AB
SPECIFIC GRAVITY, URINE: 1.01 (ref 1.005–1.030)

## 2015-09-26 LAB — CBC WITH DIFFERENTIAL/PLATELET
BASOS ABS: 0 10*3/uL (ref 0.0–0.1)
BASOS PCT: 0 %
Eosinophils Absolute: 0 10*3/uL (ref 0.0–0.7)
Eosinophils Relative: 0 %
HEMATOCRIT: 30.8 % — AB (ref 39.0–52.0)
HEMOGLOBIN: 9.4 g/dL — AB (ref 13.0–17.0)
Lymphocytes Relative: 4 %
Lymphs Abs: 0.6 10*3/uL — ABNORMAL LOW (ref 0.7–4.0)
MCH: 34.7 pg — ABNORMAL HIGH (ref 26.0–34.0)
MCHC: 30.5 g/dL (ref 30.0–36.0)
MCV: 113.7 fL — ABNORMAL HIGH (ref 78.0–100.0)
MONOS PCT: 5 %
Monocytes Absolute: 0.7 10*3/uL (ref 0.1–1.0)
NEUTROS ABS: 13.6 10*3/uL — AB (ref 1.7–7.7)
NEUTROS PCT: 91 %
Platelets: 406 10*3/uL — ABNORMAL HIGH (ref 150–400)
RBC: 2.71 MIL/uL — AB (ref 4.22–5.81)
RDW: 14.9 % (ref 11.5–15.5)
WBC: 14.9 10*3/uL — AB (ref 4.0–10.5)

## 2015-09-26 LAB — TROPONIN I: Troponin I: <0.03 ng/mL (ref <0.03)

## 2015-09-26 LAB — BASIC METABOLIC PANEL
ANION GAP: 11 (ref 5–15)
BUN: 38 mg/dL — AB (ref 6–20)
CHLORIDE: 80 mmol/L — AB (ref 101–111)
CO2: 50 mmol/L — ABNORMAL HIGH (ref 22–32)
Calcium: 9.7 mg/dL (ref 8.9–10.3)
Creatinine, Ser: 0.6 mg/dL — ABNORMAL LOW (ref 0.61–1.24)
GFR calc Af Amer: >60 mL/min (ref >60)
GFR calc non Af Amer: >60 mL/min (ref >60)
Glucose, Bld: 158 mg/dL — ABNORMAL HIGH (ref 65–99)
POTASSIUM: 3.7 mmol/L (ref 3.5–5.1)
SODIUM: 141 mmol/L (ref 135–145)

## 2015-09-26 LAB — URINE MICROSCOPIC-ADD ON

## 2015-09-26 LAB — BRAIN NATRIURETIC PEPTIDE: B NATRIURETIC PEPTIDE 5: 114 pg/mL — AB (ref 0.0–100.0)

## 2015-09-26 MED ORDER — HEPARIN SODIUM (PORCINE) 5000 UNIT/ML IJ SOLN
5000.0000 [IU] | Freq: Three times a day (TID) | INTRAMUSCULAR | Status: DC
  Administered 2015-09-26 – 2015-09-27 (×3): 5000 [IU] via SUBCUTANEOUS
  Filled 2015-09-26 (×3): qty 1

## 2015-09-26 MED ORDER — SODIUM CHLORIDE 0.9% FLUSH
3.0000 mL | Freq: Two times a day (BID) | INTRAVENOUS | Status: DC
  Administered 2015-09-26 – 2015-09-27 (×2): 3 mL via INTRAVENOUS

## 2015-09-26 MED ORDER — ACETAMINOPHEN 500 MG PO TABS: 500.0000 mg | ORAL_TABLET | Freq: Four times a day (QID) | ORAL | Status: DC | PRN

## 2015-09-26 MED ORDER — ENSURE ENLIVE PO LIQD
237.0000 mL | Freq: Two times a day (BID) | ORAL | Status: DC
  Administered 2015-09-27: 237 mL via ORAL

## 2015-09-26 MED ORDER — MORPHINE SULFATE (PF) 2 MG/ML IV SOLN
2.0000 mg | Freq: Once | INTRAVENOUS | Status: AC
  Administered 2015-09-26: 2 mg via INTRAVENOUS
  Filled 2015-09-26: qty 1

## 2015-09-26 MED ORDER — AMLODIPINE BESYLATE 5 MG PO TABS
10.0000 mg | ORAL_TABLET | Freq: Every day | ORAL | Status: DC
Start: 2015-09-27 — End: 2015-09-27
  Administered 2015-09-27: 10 mg via ORAL
  Filled 2015-09-26: qty 2

## 2015-09-26 MED ORDER — LISINOPRIL 10 MG PO TABS
20.0000 mg | ORAL_TABLET | Freq: Every day | ORAL | Status: DC
  Administered 2015-09-27: 20 mg via ORAL
  Filled 2015-09-26: qty 2

## 2015-09-26 MED ORDER — LEVOFLOXACIN IN D5W 750 MG/150ML IV SOLN
750.0000 mg | Freq: Once | INTRAVENOUS | Status: AC
  Administered 2015-09-26: 750 mg via INTRAVENOUS
  Filled 2015-09-26: qty 150

## 2015-09-26 MED ORDER — MOMETASONE FURO-FORMOTEROL FUM 200-5 MCG/ACT IN AERO
2.0000 | INHALATION_SPRAY | Freq: Two times a day (BID) | RESPIRATORY_TRACT | Status: DC
  Administered 2015-09-26 – 2015-09-27 (×2): 2 via RESPIRATORY_TRACT
  Filled 2015-09-26: qty 8.8

## 2015-09-26 MED ORDER — ALBUTEROL (5 MG/ML) CONTINUOUS INHALATION SOLN
10.0000 mg/h | INHALATION_SOLUTION | Freq: Once | RESPIRATORY_TRACT | Status: AC
  Administered 2015-09-26: 10 mg/h via RESPIRATORY_TRACT
  Filled 2015-09-26: qty 20

## 2015-09-26 MED ORDER — IPRATROPIUM BROMIDE 0.02 % IN SOLN
1.0000 mg | Freq: Once | RESPIRATORY_TRACT | Status: AC
  Administered 2015-09-26: 1 mg via RESPIRATORY_TRACT
  Filled 2015-09-26: qty 5

## 2015-09-26 MED ORDER — TIOTROPIUM BROMIDE MONOHYDRATE 18 MCG IN CAPS
18.0000 ug | ORAL_CAPSULE | Freq: Every day | RESPIRATORY_TRACT | Status: DC
  Administered 2015-09-27: 18 ug via RESPIRATORY_TRACT
  Filled 2015-09-26: qty 5

## 2015-09-26 MED ORDER — HYDROCHLOROTHIAZIDE 25 MG PO TABS
25.0000 mg | ORAL_TABLET | Freq: Every day | ORAL | Status: DC
  Administered 2015-09-27: 25 mg via ORAL
  Filled 2015-09-26: qty 1

## 2015-09-26 MED ORDER — MORPHINE SULFATE (PF) 2 MG/ML IV SOLN
2.0000 mg | INTRAVENOUS | Status: DC | PRN
  Administered 2015-09-26 – 2015-09-27 (×3): 2 mg via INTRAVENOUS
  Filled 2015-09-26 (×3): qty 1

## 2015-09-26 MED ORDER — LISINOPRIL-HYDROCHLOROTHIAZIDE 20-25 MG PO TABS: 1.0000 | ORAL_TABLET | Freq: Every day | ORAL | Status: DC

## 2015-09-26 MED ORDER — LEVOFLOXACIN IN D5W 750 MG/150ML IV SOLN
750.0000 mg | INTRAVENOUS | Status: DC
  Administered 2015-09-27: 750 mg via INTRAVENOUS
  Filled 2015-09-26: qty 150

## 2015-09-26 MED ORDER — IPRATROPIUM-ALBUTEROL 0.5-2.5 (3) MG/3ML IN SOLN: 3.0000 mL | RESPIRATORY_TRACT | Status: DC | PRN

## 2015-09-26 MED ORDER — ATORVASTATIN CALCIUM 40 MG PO TABS
80.0000 mg | ORAL_TABLET | Freq: Every day | ORAL | Status: DC
  Administered 2015-09-27: 80 mg via ORAL
  Filled 2015-09-26 (×2): qty 2

## 2015-09-26 MED ORDER — ASPIRIN EC 81 MG PO TBEC
81.0000 mg | DELAYED_RELEASE_TABLET | Freq: Every day | ORAL | Status: DC
  Administered 2015-09-26 – 2015-09-27 (×2): 81 mg via ORAL
  Filled 2015-09-26 (×2): qty 1

## 2015-09-26 MED ORDER — MOMETASONE FURO-FORMOTEROL FUM 200-5 MCG/ACT IN AERO
INHALATION_SPRAY | RESPIRATORY_TRACT | Status: AC
  Filled 2015-09-26: qty 8.8

## 2015-09-26 MED ORDER — METHYLPREDNISOLONE SODIUM SUCC 125 MG IJ SOLR
125.0000 mg | Freq: Once | INTRAMUSCULAR | Status: AC
  Administered 2015-09-26: 125 mg via INTRAVENOUS
  Filled 2015-09-26: qty 2

## 2015-09-26 NOTE — ED Triage Notes (Signed)
Pt brought in by EMS for SOB. Initial sats 50 % on 4 L/min. Received duoneb x3 with sat increase to 95 % on arrival. Denies cough. Skin pale, warm to touch. Also reports increase weakness and hoarseness with SOB x 3 days. BP 134/80

## 2015-09-26 NOTE — ED Notes (Cosign Needed)
Pt requesting something for chest pain. Per Dr Marin Comment may have morphine 2 mg IV now

## 2015-09-26 NOTE — ED Notes (Signed)
Pt arrived with neb in process. Once completed O2 sats gradually decreased to 90 %. Pt reports that he wears O2 @ 8 L/min at home. Increased O2 to 8 L/min. RT notified of orders

## 2015-09-26 NOTE — H&P (Signed)
[facility] History and Physical  Dominic Martinez [id number] DOB: 1945/05/29    PCP:   No primary care provider on file.   Chief Complaint: SOB.  HPI: Dominic Martinez is an 70 y.o. 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  In the ER, CXR showed recurred left pleural effusion, along with some atelectasis vs PNA.  His Cr was normal.  He was given IV Steroid, and IV Levaquin, along with nebs.and hospitalist was asked to admit him for further work up.  He actually was going get hospice and palliative care consultation, but was not able to get that accomplished.  He expressed today that he is amenable to having Tx, including thoracocentesis, but his most desire was to keep him comfortable.  He is a DNR.   Rewiew of Systems:  Constitutional: Negative for malaise, fever and chills. No significant weight loss or weight gain Eyes: Negative for eye pain, redness and discharge, diplopia, visual changes, or flashes of light. ENMT: Negative for ear pain, hoarseness, nasal congestion, sinus pressure and sore throat. No headaches; tinnitus, drooling, or problem swallowing. Cardiovascular: Negative for chest pain, palpitations, diaphoresis,  and peripheral edema. ; No orthopnea, PND Respiratory: Negative for cough, hemoptysis, wheezing and stridor. No pleuritic chestpain. Gastrointestinal: Negative for nausea, vomiting, diarrhea, constipation, abdominal pain, melena, blood in stool, hematemesis, jaundice and rectal bleeding.    Genitourinary: Negative for frequency, dysuria, incontinence,flank pain and hematuria; Musculoskeletal: Negative for back pain and neck pain. Negative for swelling and trauma.;  Skin: . Negative for pruritus, rash, abrasions, bruising and skin lesion.; ulcerations Neuro: Negative for headache, lightheadedness and  neck stiffness. Negative for weakness, altered level of consciousness , altered mental status, extremity weakness, burning feet, involuntary movement, seizure and syncope.  Psych: negative for anxiety, insomnia, tearfulness, panic attacks, hallucinations, paranoia, suicidal or homicidal ideation    Past Medical History:  Diagnosis Date  . Back injury 1994  . Cancer ([place])    Lung cancer (left)  . COPD (chronic obstructive pulmonary disease) ([place])   . Gangrene ([place])   . Hypertension   . On home O2   . Pleural effusion on left 01/2015  . Pneumonia     Past Surgical History:  Procedure Laterality Date  . ABDOMINAL SURGERY    . APPENDECTOMY      Medications:  HOME MEDS: Prior to Admission medications   Medication Sig Start Date End Date Taking? Authorizing Provider  amLODipine (NORVASC) 10 MG tablet Take 10 mg by mouth daily. 07/10/15  Yes Historical Provider, MD  lisinopril-hydrochlorothiazide (PRINZIDE,ZESTORETIC) 20-25 MG tablet Take 1 tablet by mouth daily.   Yes Historical Provider, MD  oxyCODONE-acetaminophen (PERCOCET) 7.5-325 MG tablet Take 1 tablet by mouth every 6 (six) hours as needed for moderate pain or severe pain.   Yes Historical Provider, MD  predniSONE (DELTASONE) 10 MG tablet Take 40mg  po daily for 3 days then 30mg  daily for 3 daily then 20mg  daily for 3 days then 10mg  daily for 3 days then stop 02/18/15  Yes Kathie Dike, MD  tiotropium (SPIRIVA) 18 MCG inhalation capsule Place 1 capsule (18 mcg total) into inhaler and inhale daily. 06/21/12  Yes Bobby Rumpf York, PA-C  acetaminophen (TYLENOL) 500 MG tablet Take 500-1,000 mg by mouth daily as needed for pain (Only used on occasion for pain).  Historical Provider, MD  ADVAIR DISKUS 250-50 MCG/DOSE AEPB INHALE 1 PUFF BY MOUTH TWICE A DAY RINSE MOUTH AFTER USE 01/26/15   Historical Provider, MD  albuterol (PROVENTIL HFA;VENTOLIN HFA) 108 (90 BASE) MCG/ACT inhaler Inhale 2 puffs into the lungs every 6 (six) hours as  needed for wheezing. 06/21/12   Bobby Rumpf York, PA-C  amLODipine (NORVASC) 5 MG tablet Take 5 mg by mouth daily.    Historical Provider, MD  aspirin 81 MG EC tablet Take 1 tablet (81 mg total) by mouth daily. 02/18/15   Kathie Dike, MD  atorvastatin (LIPITOR) 80 MG tablet Take 1 tablet (80 mg total) by mouth daily at 6 PM. 02/18/15   Kathie Dike, MD  furosemide (LASIX) 40 MG tablet Take 1 tablet (40 mg total) by mouth daily as needed for fluid. 02/18/15   Kathie Dike, MD  guaiFENesin (MUCINEX) 600 MG 12 hr tablet Take 1 tablet (600 mg total) by mouth 2 (two) times daily. Patient taking differently: Take 600 mg by mouth 2 (two) times daily as needed for cough or to loosen phlegm.  06/21/12   Bobby Rumpf York, PA-C  ipratropium-albuterol (DUONEB) 0.5-2.5 (3) MG/3ML SOLN Take 3 mLs by nebulization every 6 (six) hours as needed.    Historical Provider, MD  SYMBICORT 160-4.5 MCG/ACT inhaler Inhale 1 puff into the lungs 2 (two) times daily. 08/20/15   Historical Provider, MD  theophylline (UNIPHYL) 400 MG 24 hr tablet TAKE 1/2 TABLET BY MOUTH TWICE DAILY AS DIRECTED 01/09/15   Historical Provider, MD     Allergies:  No Known Allergies  Social History:   reports that he has quit smoking. He has a 25.00 pack-year smoking history. He has never used smokeless tobacco. He reports that he does not drink alcohol or use drugs.  Family History: Family History  Problem Relation Age of Onset  . Heart attack Brother   . Coronary artery disease Brother   . Cancer Brother      Physical Exam: Vitals:   09/26/15 1400 09/26/15 1415 09/26/15 1430 09/26/15 1500  BP: 131/89  146/65 128/65  Pulse: 107 112 (!) 121 115  Resp: 24 22 22 25   Temp:      TempSrc:      SpO2: 100% 99% 98% 95%  Weight:      Height:       Blood pressure 128/65, pulse 115, temperature 98.4 F (36.9 C), temperature source Axillary, resp. rate 25, height 5\' 8"  (1.727 m), weight 43.1 kg (95 lb), SpO2 95 %.  GEN:  Pleasant  patient lying in the stretcher in no acute distress; cooperative with exam. PSYCH:  alert and oriented x4; does not appear anxious or depressed; affect is appropriate. HEENT: Mucous membranes pink and anicteric; PERRLA; EOM intact; no cervical lymphadenopathy nor thyromegaly or carotid bruit; no JVD; There were no stridor. Neck is very supple. Breasts:: Not examined CHEST WALL: No tenderness CHEST: Normal respiration, clear to auscultation bilaterally. But decrease BS.  No rales  HEART: Regular rate and rhythm.  There are no murmur, rub, or gallops.   BACK: No kyphosis or scoliosis; no CVA tenderness ABDOMEN: soft and non-tender; no masses, no organomegaly, normal abdominal bowel sounds; no pannus; no intertriginous candida. There is no rebound and no distention. Rectal Exam: Not done EXTREMITIES: No bone or joint deformity; age-appropriate arthropathy of the hands and knees; no edema; no ulcerations.  There is no calf tenderness. Genitalia: not examined PULSES: 2+ and symmetric SKIN: Normal hydration no rash or  ulceration CNS: Cranial nerves 2-12 grossly intact no focal lateralizing neurologic deficit.  Speech is fluent; uvula elevated with phonation, facial symmetry and tongue midline. DTR are normal bilaterally, cerebella exam is intact, barbinski is negative and strengths are equaled bilaterally.  No sensory loss.   Labs on Admission:  Basic Metabolic Panel:  Recent Labs Lab 09/26/15 1255  NA 141  K 3.7  CL 80*  CO2 50*  GLUCOSE 158*  BUN 38*  CREATININE 0.60*  CALCIUM 9.7   CBC:  Recent Labs Lab 09/26/15 1255  WBC 14.9*  NEUTROABS 13.6*  HGB 9.4*  HCT 30.8*  MCV 113.7*  PLT 406*   Cardiac Enzymes:  Recent Labs Lab 09/26/15 1255  TROPONINI <0.03   Radiological Exams on Admission: Dg Chest Port 1 View  Result Date: 09/26/2015 CLINICAL DATA:  Worsening weakness and shortness of breath. History of left-sided lung cancer. EXAM: PORTABLE CHEST 1 VIEW COMPARISON:   02/15/2015 FINDINGS: Right chest shows emphysema. Increased density in the right upper lobe could represent pneumonia. On the left, there is a new left effusion with left base volume loss. There is abnormal perihilar density on the left which could represent a combination of mass, collapse and pneumonia. More peripheral density in the left upper lobe could represent volume loss, pneumonia or interstitial spread of cancer. IMPRESSION: New effusion on the left. Left lower lobe collapse. Left perihilar density that could represent infiltrate, collapse or tumor. Left upper lobe density could represent collapse, pneumonia or interstitial spread of cancer. New density in the right upper lobe that could represent pneumonia. Electronically Signed   By: Nelson Chimes M.D.   On: 09/26/2015 13:13   EKG: Independently reviewed.   Assessment/Plan Present on Admission: . CAP (community acquired pneumonia) . COPD with acute exacerbation ([place]) . A-fib ([place])  PLAN:  End stage COPD with possible superimposed PNA:  Will continue with IV Levaquin.  He is not wheezing significantly, so will not require more steroids.  Will consult palliative care, as per prior plan.  For now, will continue with treatment, but if he deteriorates, he wishes to focus more on comfort measure.   Chronic pain syndrome:  Given his preference to focus on comfort measure, will give him some IV morphine.    Left pleural effusion:  Will request IR to perform thoracocentesis if possible.   Other plans as per orders. Code Status: DNR,     Orvan Falconer, MD. FACP [facility] Pager [phone] 7pm to 7am.  09/26/2015, 3:33 PM

## 2015-09-26 NOTE — ED Provider Notes (Signed)
[facility] DEPT Provider Note   CSN: ID:[id number] Arrival date & time: 09/26/15  1231  First Provider Contact:  None       History   Chief Complaint Chief Complaint  Patient presents with  . Shortness of Breath    HPI Dominic Martinez is a 70 y.o. male.  HPI  Pt was seen at 1245.  Per pt, c/o gradual onset and worsening of persistent cough, wheezing and SOB for the past 2 to 3 days. Has been using home MDI and nebs without relief. Hsa been associated with generalized weakness and "hoarse voice." Pt states his chest "always hurts," denies change in his baseline CP. EMS states pt's O2 Sats were "50% on 4L N/C;" increased to "95% after duoneb."  Denies CP/palpitations, no back pain, no abd pain, no N/V/D, no fevers, no rash.    Past Medical History:  Diagnosis Date  . Back injury 1994  . Cancer ([place])    Lung cancer (left)  . COPD (chronic obstructive pulmonary disease) ([place])   . Gangrene ([place])   . Hypertension   . On home O2   . Pleural effusion on left 01/2015  . Pneumonia     Patient Active Problem List   Diagnosis Date Noted  . Acute on chronic respiratory failure with hypoxia and hypercapnia ([place]) 02/08/2015  . CAP (community acquired pneumonia) 02/08/2015  . Thrombocytopenia ([place]) 02/08/2015  . HTN (hypertension) 02/08/2015  . COPD exacerbation ([place]) 02/07/2015  . Hypernatremia 02/07/2015  . A-fib ([place]) 06/20/2012  . Acute respiratory failure ([place]) 06/18/2012  . Nicotine abuse 06/18/2012  . COPD with acute exacerbation ([place]) 06/17/2012  . NSTEMI (non-ST elevated myocardial infarction) ([place]) 06/17/2012  . Accelerated hypertension 06/17/2012  . Pulmonary edema 06/17/2012    Past Surgical History:  Procedure Laterality Date  . ABDOMINAL SURGERY    . APPENDECTOMY         Home Medications    Prior to Admission medications   Medication Sig Start Date End Date Taking? Authorizing Provider  acetaminophen (TYLENOL) 500 MG tablet Take 500-1,000 mg by mouth  daily as needed for pain (Only used on occasion for pain).    Historical Provider, MD  ADVAIR DISKUS 250-50 MCG/DOSE AEPB INHALE 1 PUFF BY MOUTH TWICE A DAY RINSE MOUTH AFTER USE 01/26/15   Historical Provider, MD  albuterol (PROVENTIL HFA;VENTOLIN HFA) 108 (90 BASE) MCG/ACT inhaler Inhale 2 puffs into the lungs every 6 (six) hours as needed for wheezing. 06/21/12   Bobby Rumpf York, PA-C  amLODipine (NORVASC) 5 MG tablet Take 5 mg by mouth daily.    Historical Provider, MD  aspirin 81 MG EC tablet Take 1 tablet (81 mg total) by mouth daily. 02/18/15   Kathie Dike, MD  atorvastatin (LIPITOR) 80 MG tablet Take 1 tablet (80 mg total) by mouth daily at 6 PM. 02/18/15   Kathie Dike, MD  furosemide (LASIX) 40 MG tablet Take 1 tablet (40 mg total) by mouth daily as needed for fluid. 02/18/15   Kathie Dike, MD  guaiFENesin (MUCINEX) 600 MG 12 hr tablet Take 1 tablet (600 mg total) by mouth 2 (two) times daily. Patient taking differently: Take 600 mg by mouth 2 (two) times daily as needed for cough or to loosen phlegm.  06/21/12   Bobby Rumpf York, PA-C  ipratropium-albuterol (DUONEB) 0.5-2.5 (3) MG/3ML SOLN Take 3 mLs by nebulization every 6 (six) hours as needed.    Historical Provider, MD  predniSONE (DELTASONE) 10 MG tablet Take 40mg  po daily for  3 days then 30mg  daily for 3 daily then 20mg  daily for 3 days then 10mg  daily for 3 days then stop 02/18/15   Kathie Dike, MD  theophylline (UNIPHYL) 400 MG 24 hr tablet TAKE 1/2 TABLET BY MOUTH TWICE DAILY AS DIRECTED 01/09/15   Historical Provider, MD  tiotropium (SPIRIVA) 18 MCG inhalation capsule Place 1 capsule (18 mcg total) into inhaler and inhale daily. 06/21/12   Melton Alar, PA-C    Family History Family History  Problem Relation Age of Onset  . Heart attack Brother   . Coronary artery disease Brother   . Cancer Brother     Social History Social History  Substance Use Topics  . Smoking status: Former Smoker    Packs/day: 0.50     Years: 50.00  . Smokeless tobacco: Never Used  . Alcohol use No     Allergies   Review of patient's allergies indicates no known allergies.   Review of Systems Review of Systems ROS: Statement: All systems negative except as marked or noted in the HPI; Constitutional: Negative for fever and chills. +generalized weakness/fatigue. ; ; Eyes: Negative for eye pain, redness and discharge. ; ; ENMT: Negative for ear pain, nasal congestion, sinus pressure and sore throat. +hoarse voice.; ; Cardiovascular: Negative for chest pain, palpitations, diaphoresis, and peripheral edema. ; ; Respiratory: +cough, wheezing, SOB. Negative for stridor. ; ; Gastrointestinal: Negative for nausea, vomiting, diarrhea, abdominal pain, blood in stool, hematemesis, jaundice and rectal bleeding. . ; ; Genitourinary: Negative for dysuria, flank pain and hematuria. ; ; Musculoskeletal: Negative for back pain and neck pain. Negative for swelling and trauma.; ; Skin: Negative for pruritus, rash, abrasions, blisters, bruising and skin lesion.; ; Neuro: Negative for headache, lightheadedness and neck stiffness. Negative for altered level of consciousness, altered mental status, extremity weakness, paresthesias, involuntary movement, seizure and syncope.       Physical Exam Updated Vital Signs BP 128/81 (BP Location: Left Arm)   Pulse 95   Temp 98.4 F (36.9 C) (Axillary)   Resp 22   Ht 5\' 8"  (1.727 m)   Wt 95 lb (43.1 kg)   SpO2 100%   BMI 14.44 kg/m    Patient Vitals for the past 24 hrs:  BP Temp Temp src Pulse Resp SpO2 Height Weight  09/26/15 1330 122/63 - - 93 15 100 % - -  09/26/15 1312 - - - - - 97 % - -  09/26/15 1300 122/62 - - 95 20 90 % - -  09/26/15 1239 - - - - - 100 % - -  09/26/15 1237 128/81 98.4 F (36.9 C) Axillary 95 22 100 % 5\' 8"  (1.727 m) 95 lb (43.1 kg)     Physical Exam 1250: Physical examination:  Nursing notes reviewed; Vital signs and O2 SAT reviewed;  Constitutional: Cachetic.  Uncomfortable appearing.; Head:  Normocephalic, atraumatic; Eyes: EOMI, PERRL, No scleral icterus; ENMT: Mouth and pharynx normal, Mucous membranes dry. Mouth and pharynx without lesions. No tonsillar exudates. No intra-oral edema. No submandibular or sublingual edema. +hoarse voice. No drooling, no stridor. No pain with manipulation of larynx. No trismus.; Neck: Supple, Full range of motion; Cardiovascular: Tachycardic rate and rhythm, No gallop; Respiratory: Breath sounds diminished & equal bilaterally, faint scattered wheezes. Moist cough. Speaking short sentences, sitting upright, tachypneic.; Chest: Nontender, Movement normal; Abdomen: Soft, Nontender, Nondistended, Normal bowel sounds; Genitourinary: No CVA tenderness; Extremities: Pulses normal, No tenderness, No edema, No calf edema or asymmetry.; Neuro: AA&Ox3, Major CN grossly intact.  Speech clear. No gross focal motor or sensory deficits in extremities.; Skin: Color normal, Warm, Dry.   ED Treatments / Results  Labs (all labs ordered are listed, but only abnormal results are displayed)   EKG  EKG Interpretation  Date/Time:  Thursday September 26 2015 12:36:41 EDT Ventricular Rate:  93 PR Interval:    QRS Duration: 87 QT Interval:  292 QTC Calculation: 364 R Axis:   87 Text Interpretation:  Sinus tachycardia Multiple premature complexes, vent & supraven Probable left atrial enlargement Borderline right axis deviation Repol abnrm suggests ischemia, diffuse leads Baseline wander When compared with ECG of 02/10/2015 Nonspecific ST and T wave abnormality is now Present Confirmed by [facility]  MD, Nunzio Cory [phone]) on 09/26/2015 1:04:10 PM       Radiology   Procedures Procedures (including critical care time)  Medications Ordered in ED Medications  albuterol (PROVENTIL,VENTOLIN) solution continuous neb (not administered)  ipratropium (ATROVENT) nebulizer solution 1 mg (not administered)  methylPREDNISolone sodium succinate  (SOLU-MEDROL) 125 mg/2 mL injection 125 mg (not administered)     Initial Impression / Assessment and Plan / ED Course  I have reviewed the triage vital signs and the nursing notes.  Pertinent labs & imaging results that were available during my care of the patient were reviewed by me and considered in my medical decision making (see chart for details).  MDM Reviewed: previous chart, nursing note and vitals Reviewed previous: labs and ECG Interpretation: labs, ECG and x-ray Total time providing critical care: 30-74 minutes. This excludes time spent performing separately reportable procedures and services. Consults: admitting MD    CRITICAL CARE Performed by: Alfonzo Feller Total critical care time: 35 minutes Critical care time was exclusive of separately billable procedures and treating other patients. Critical care was necessary to treat or prevent imminent or life-threatening deterioration. Critical care was time spent personally by me on the following activities: development of treatment plan with patient and/or surrogate as well as nursing, discussions with consultants, evaluation of patient's response to treatment, examination of patient, obtaining history from patient or surrogate, ordering and performing treatments and interventions, ordering and review of laboratory studies, ordering and review of radiographic studies, pulse oximetry and re-evaluation of patient's condition.   Results for orders placed or performed during the hospital encounter of [id number]  Basic metabolic panel  Result Value Ref Range   Sodium 141 135 - 145 mmol/L   Potassium 3.7 3.5 - 5.1 mmol/L   Chloride 80 (L) 101 - 111 mmol/L   CO2 50 (H) 22 - 32 mmol/L   Glucose, Bld 158 (H) 65 - 99 mg/dL   BUN 38 (H) 6 - 20 mg/dL   Creatinine, Ser 0.60 (L) 0.61 - 1.24 mg/dL   Calcium 9.7 8.9 - 10.3 mg/dL   GFR calc non Af Amer >60 >60 mL/min   GFR calc Af Amer >60 >60 mL/min   Anion gap 11 5 - 15  Brain  natriuretic peptide  Result Value Ref Range   B Natriuretic Peptide 114.0 (H) 0.0 - 100.0 pg/mL  Troponin I  Result Value Ref Range   Troponin I <0.03 <0.03 ng/mL  CBC with Differential  Result Value Ref Range   WBC 14.9 (H) 4.0 - 10.5 K/uL   RBC 2.71 (L) 4.22 - 5.81 MIL/uL   Hemoglobin 9.4 (L) 13.0 - 17.0 g/dL   HCT 30.8 (L) 39.0 - 52.0 %   MCV 113.7 (H) 78.0 - 100.0 fL   MCH 34.7 (H) 26.0 - 34.0 pg  MCHC 30.5 30.0 - 36.0 g/dL   RDW 14.9 11.5 - 15.5 %   Platelets 406 (H) 150 - 400 K/uL   Neutrophils Relative % 91 %   Neutro Abs 13.6 (H) 1.7 - 7.7 K/uL   Lymphocytes Relative 4 %   Lymphs Abs 0.6 (L) 0.7 - 4.0 K/uL   Monocytes Relative 5 %   Monocytes Absolute 0.7 0.1 - 1.0 K/uL   Eosinophils Relative 0 %   Eosinophils Absolute 0.0 0.0 - 0.7 K/uL   Basophils Relative 0 %   Basophils Absolute 0.0 0.0 - 0.1 K/uL  Urinalysis, Routine w reflex microscopic  Result Value Ref Range   Color, Urine YELLOW YELLOW   APPearance CLEAR CLEAR   Specific Gravity, Urine 1.010 1.005 - 1.030   pH 7.0 5.0 - 8.0   Glucose, UA NEGATIVE NEGATIVE mg/dL   Hgb urine dipstick NEGATIVE NEGATIVE   Bilirubin Urine NEGATIVE NEGATIVE   Ketones, ur NEGATIVE NEGATIVE mg/dL   Protein, ur 30 (A) NEGATIVE mg/dL   Nitrite NEGATIVE NEGATIVE   Leukocytes, UA NEGATIVE NEGATIVE  Urine microscopic-add on  Result Value Ref Range   Squamous Epithelial / LPF 0-5 (A) NONE SEEN   WBC, UA 0-5 0 - 5 WBC/hpf   RBC / HPF 0-5 0 - 5 RBC/hpf   Bacteria, UA RARE (A) NONE SEEN   Casts HYALINE CASTS (A) NEGATIVE  I-Stat CG4 Lactic Acid, ED  Result Value Ref Range   Lactic Acid, Venous 2.04 (HH) 0.5 - 1.9 mmol/L   Dg Chest Port 1 View Result Date: 09/26/2015 CLINICAL DATA:  Worsening weakness and shortness of breath. History of left-sided lung cancer. EXAM: PORTABLE CHEST 1 VIEW COMPARISON:  02/15/2015 FINDINGS: Right chest shows emphysema. Increased density in the right upper lobe could represent pneumonia. On the left,  there is a new left effusion with left base volume loss. There is abnormal perihilar density on the left which could represent a combination of mass, collapse and pneumonia. More peripheral density in the left upper lobe could represent volume loss, pneumonia or interstitial spread of cancer. IMPRESSION: New effusion on the left. Left lower lobe collapse. Left perihilar density that could represent infiltrate, collapse or tumor. Left upper lobe density could represent collapse, pneumonia or interstitial spread of cancer. New density in the right upper lobe that could represent pneumonia. Electronically Signed   By: Nelson Chimes M.D.   On: 09/26/2015 13:13    1220:  On arrival: pt sitting upright, tachypneic, tachycardic, Sats 90 % O2 4L N/C, lungs diminished, faint wheezes, moist cough. IV solumedrol and hour long neb started. After neb: pt appears more comfortable at rest, less tachypneic, leaning back on stretcher, Sats 97-100% on O2 7L N/C, lungs continue diminished. IV abx started for CAP.   Dx and testing d/w pt.  Questions answered.  Verb understanding, agreeable to admit. T/C to [facility] [place] Comment, case discussed, including:  HPI, pertinent PM/SHx, VS/PE, dx testing, ED course and treatment:  Agreeable to admit, requests he will come to the ED for evaluation.      Final Clinical Impressions(s) / ED Diagnoses   Final diagnoses:  SOB (shortness of breath)    New Prescriptions New Prescriptions   No medications on file     Francine Graven, DO 09/29/15 1428

## 2015-09-27 ENCOUNTER — Encounter (HOSPITAL_COMMUNITY): Payer: Self-pay | Admitting: Primary Care

## 2015-09-27 ENCOUNTER — Inpatient Hospital Stay (HOSPITAL_COMMUNITY): Payer: Medicare Other

## 2015-09-27 DIAGNOSIS — Z515 Encounter for palliative care: Secondary | ICD-10-CM

## 2015-09-27 DIAGNOSIS — Z7189 Other specified counseling: Secondary | ICD-10-CM

## 2015-09-27 MED ORDER — MORPHINE SULFATE (CONCENTRATE) 10 MG/0.5ML PO SOLN: 2.5000 mg | ORAL | Status: DC | PRN

## 2015-09-27 MED ORDER — SENNOSIDES-DOCUSATE SODIUM 8.6-50 MG PO TABS: 1.0000 | ORAL_TABLET | Freq: Two times a day (BID) | ORAL | 1 refills | Status: AC

## 2015-09-27 MED ORDER — MORPHINE SULFATE (CONCENTRATE) 10 MG/0.5ML PO SOLN: 2.5000 mg | ORAL | 0 refills | Status: AC | PRN

## 2015-09-27 MED ORDER — SENNOSIDES-DOCUSATE SODIUM 8.6-50 MG PO TABS
1.0000 | ORAL_TABLET | Freq: Two times a day (BID) | ORAL | Status: DC
  Administered 2015-09-27: 1 via ORAL
  Filled 2015-09-27: qty 1

## 2015-09-27 MED ORDER — ASPIRIN 81 MG PO TBEC: 81.0000 mg | DELAYED_RELEASE_TABLET | Freq: Every day | ORAL | 1 refills | Status: AC

## 2015-09-27 NOTE — Progress Notes (Signed)
Discharge instructions given on medications,and follow up visits,patient,and family verbalized understanding. Prescription sent with patient. Staff accompanied patient to an awaiting vehicle.

## 2015-09-27 NOTE — Consult Note (Signed)
Consultation Note Date: 09/27/2015   Patient Name: Dominic Martinez  DOB: 03/14/1945  MRN: [id number]  Age / Sex: 70 y.o., male  PCP: No primary care provider on file. Referring Physician: Orvan Falconer, MD  Reason for Consultation: Disposition, Establishing goals of care, Hospice Evaluation, Non pain symptom management, Pain control and Psychosocial/spiritual support  HPI/Patient Profile: 70 y.o. male  with past medical history of Back injury, COPD D oxygen dependent, lung cancer (although patient states Dominic Martinez told him it was not cancer), hypertension, pleural effusion on left admitted on 09/26/2015 with atelectasis versus pneumonia.   Clinical Assessment and Goals of Care: Dominic Martinez is resting quietly in bed. No family at bedside at this time. We talk about his health history.  He shares that he was told in [facility] that he had lung cancer, but went to [facility] where they told him that it was not cancer because it had shrunk without chemo or radiation. He states that regardless he knows he is very ill, and even if he had cancer he wouldn't seek treatment. We talk about his end-stage COPD, and his functional status. He shares that he has weighed about the same, 115 pounds, for the last 3 years. He shares that he is not able to eat much.   We talk about choices, and he states that he would not want a PEG tube, he would not want a permanent pleurX drain, and he does not want CPR or intubation, instead, allow a natural death. He shares that his wife can abide these wishes.  He shares that he desires he his place of death to be at his home. He seems somewhat reluctant, but accepting of hospice. I continue to encourage him that they will provide for his needs, and the needs of his family. I encourage him that they will let him direct his care.  We did not discuss prognosis, but I feel that he has 6  months or less, possibly 3 months or less based on current declines and chronic illness burden.  Healthcare power of attorney NEXT OF KIN, Mr. Dominic Martinez shares that he and his family make decisions together, he has no paperwork.   SUMMARY OF RECOMMENDATIONS   continue to treat the treatable, but no extraordinary measures. Dominic Martinez is requesting hospice in his home with [facility] [place].  He states that he will "do what I have to do".   Code Status/Advance Care Planning:  DNR - we discussed the concepts of allow a natural death  Symptom Management:   per hospitalist, recommend ORAL morphine for pain management as this is how symptoms will be managed at home with hospice.    Suggest morphine 2.5 mg PO/SL Q4 hours scheduled (may hold for respiratory rate of 12 or less), along with morphine 2.5 mg PO/SL Q one hour PRN (please give for respiratory rate of 22 or greater).   Suggest Senna-S 2 tabs QHS, if morphine dose is scheduled.   Palliative Prophylaxis:   Frequent Pain Assessment, Oral Care  and Turn Reposition  Additional Recommendations (Limitations, Scope, Preferences):  Continue to treat the treatable, no PEG tube, no permanent pleurX, no CPR/intubation. Home with the support of [facility] [place].  Psycho-social/Spiritual:   Desire for further Chaplaincy support:no  Additional Recommendations: Caregiving  Support/Resources and Education on Hospice  Prognosis:   < 6 months, likely based on chronic disease burden, end-stage COPD, generalize frailty, functional line.  Discharge Planning: Patient is requesting home with [facility] of [place]. He agrees to have information sent to hospice, but requests that he have enough time to speak with his wife prior to hospice calling them.      Primary Diagnoses: Present on Admission: . CAP (community acquired pneumonia) . COPD with acute exacerbation ([place]) . A-fib [facility])   I have reviewed the medical  record, interviewed the patient and family, and examined the patient. The following aspects are pertinent.  Past Medical History:  Diagnosis Date  . Back injury 1994  . Cancer ([place])    Lung cancer (left)  . COPD (chronic obstructive pulmonary disease) ([place])   . Gangrene ([place])   . Hypertension   . On home O2   . Pleural effusion on left 01/2015  . Pneumonia    Social History   Social History  . Marital status: Married    Spouse name: [name]  . Number of children: [name]  . Years of education: [name]   Social History Main Topics  . Smoking status: Former Smoker    Packs/day: 0.50    Years: 50.00  . Smokeless tobacco: Never Used  . Alcohol use No  . Drug use: No  . Sexual activity: Not Asked   Other Topics Concern  . None   Social History Narrative  . None   Family History  Problem Relation Age of Onset  . Heart attack Brother   . Coronary artery disease Brother   . Cancer Brother    Scheduled Meds: . amLODipine  10 mg Oral Daily  . aspirin EC  81 mg Oral Daily  . atorvastatin  80 mg Oral q1800  . feeding supplement (ENSURE ENLIVE)  237 mL Oral BID BM  . heparin  5,000 Units Subcutaneous Q8H  . lisinopril  20 mg Oral Daily   And  . hydrochlorothiazide  25 mg Oral Daily  . levofloxacin (LEVAQUIN) IV  750 mg Intravenous Q24H  . mometasone-formoterol  2 puff Inhalation BID  . sodium chloride flush  3 mL Intravenous Q12H  . tiotropium  18 mcg Inhalation Daily   Continuous Infusions:  PRN Meds:.acetaminophen, ipratropium-albuterol, morphine injection Medications Prior to Admission:  Prior to Admission medications   Medication Sig Start Date End Date Taking? Authorizing Provider  amLODipine (NORVASC) 10 MG tablet Take 10 mg by mouth daily. 07/10/15  Yes Historical Provider, MD  atorvastatin (LIPITOR) 80 MG tablet Take 1 tablet (80 mg total) by mouth daily at 6 PM. 02/18/15  Yes Kathie Dike, MD  furosemide (LASIX) 40 MG tablet Take 1 tablet (40 mg total) by mouth daily  as needed for fluid. 02/18/15  Yes Kathie Dike, MD  lisinopril-hydrochlorothiazide (PRINZIDE,ZESTORETIC) 20-25 MG tablet Take 1 tablet by mouth daily.   Yes Historical Provider, MD  oxyCODONE-acetaminophen (PERCOCET) 7.5-325 MG tablet Take 1 tablet by mouth every 6 (six) hours as needed for moderate pain or severe pain.   Yes Historical Provider, MD  SYMBICORT 160-4.5 MCG/ACT inhaler Inhale 1 puff into the lungs 2 (two) times daily. 08/20/15  Yes Historical Provider, MD  tiotropium [facility])  18 MCG inhalation capsule Place 1 capsule (18 mcg total) into inhaler and inhale daily. 06/21/12  Yes Bobby Rumpf York, PA-C  acetaminophen (TYLENOL) 500 MG tablet Take 500-1,000 mg by mouth daily as needed for pain (Only used on occasion for pain).    Historical Provider, MD  albuterol (PROVENTIL HFA;VENTOLIN HFA) 108 (90 BASE) MCG/ACT inhaler Inhale 2 puffs into the lungs every 6 (six) hours as needed for wheezing. 06/21/12   Bobby Rumpf York, PA-C  guaiFENesin (MUCINEX) 600 MG 12 hr tablet Take 1 tablet (600 mg total) by mouth 2 (two) times daily. Patient not taking: Reported on 09/26/2015 06/21/12   Melton Alar, PA-C  ipratropium-albuterol (DUONEB) 0.5-2.5 (3) MG/3ML SOLN Take 3 mLs by nebulization every 6 (six) hours as needed.    Historical Provider, MD   No Known Allergies Review of Systems  Unable to perform ROS: Acuity of condition    Physical Exam  Constitutional: He is oriented to person, place, and time. No distress.  Cachectic, chronically ill appearing, frail  HENT:  Head: Normocephalic and atraumatic.  Cardiovascular: Normal rate.   Pulmonary/Chest: Effort normal. No respiratory distress.  Mild work of breathing noted, occasional productive cough  Abdominal: Soft. He exhibits no distension.  Musculoskeletal:  Severe muscle wasting  Neurological: He is alert and oriented to person, place, and time.  Skin: Skin is warm and dry.  Psychiatric:  Somewhat flat affect    Vital Signs: BP  127/65 (BP Location: Left Arm)   Pulse 77   Temp 98.5 F (36.9 C) (Oral)   Resp (!) 22   Ht 5\' 8"  (1.727 m)   Wt 43.1 kg (95 lb)   SpO2 96%   BMI 14.44 kg/m  Pain Assessment: No/denies pain   Pain Score: Asleep   SpO2: SpO2: 96 % O2 Device:SpO2: 96 % O2 Flow Rate: .O2 Flow Rate (L/min): 4.5 L/min  IO: Intake/output summary:  Intake/Output Summary (Last 24 hours) at 09/27/15 1158 Last data filed at 09/27/15 0500  Gross per 24 hour  Intake                0 ml  Output              400 ml  Net             -400 ml    LBM: Last BM Date: 09/26/15 Baseline Weight: Weight: 43.1 kg (95 lb) Most recent weight: Weight: 43.1 kg (95 lb)     Palliative Assessment/Data:   Flowsheet Rows   Flowsheet Row Most Recent Value  Intake Tab  Referral Department  Hospitalist  Unit at Time of Referral  Med/Surg Unit  Palliative Care Primary Diagnosis  Pulmonary  Date Notified  09/26/15  Date of Admission  09/26/15  Date first seen by Palliative Care  09/27/15  # of days Palliative referral response time  1 Day(s)  # of days IP prior to Palliative referral  0  Clinical Assessment  Palliative Performance Scale Score  50%  Pain Max last 24 hours  9  Pain Min Last 24 hours  3  Dyspnea Max Last 24 Hours  Not able to report  Dyspnea Min Last 24 hours  Not able to report  Psychosocial & Spiritual Assessment  Palliative Care Outcomes  Patient/Family meeting held?  Yes  Who was at the meeting?  Patient only at this time  [place] regarding hospice, Provided advance care planning, Completed durable DNR, Transitioned to hospice, Clarified  goals of care  Patient/Family wishes: Interventions discontinued/not started   Mechanical Ventilation, PEG  Palliative Care follow-up planned  -- [Follow-up while at [facility]]      Time In: 1020 Time Out: 1115 Time Total: 55 minutes Greater than 50%  of this time was spent counseling and coordinating care related to the above  assessment and plan.  Signed by: Drue Novel, NP   Please contact Palliative Medicine Team phone at [phone] for questions and concerns.  For individual provider: See Shea Evans

## 2015-09-27 NOTE — Care Management Note (Signed)
Case Management Note  Patient Details  Name: Dominic Martinez MRN: [id number] Date of Birth: Jan 06, 1946  Additional Comments: Discharge summary faxed to [facility].   [place] Mcdanel, Chauncey Reading, RN 09/27/2015, 5:34 PM

## 2015-09-27 NOTE — Progress Notes (Signed)
Nutrition Brief Note  Patient identified on the Malnutrition Screening Tool (MST) Report  Wt Readings from Last 15 Encounters:  09/26/15 95 lb (43.1 kg)  02/11/15 110 lb 7.2 oz (50.1 kg)  06/21/12 111 lb 1.6 oz (50.4 kg)    Body mass index is 14.44 kg/m. Patient meets criteria for underweight based on current BMI.   Pt has end stage copd and possible lung cancer. He was seen by palliative care today and has elected to go home with hospice. Anticipate discharge in near future with hospice.   No nutrition interventions warranted at this time. If nutrition issues arise, please consult RD.    Burtis Junes RD, LDN, CNSC Clinical Nutrition Pager: [phone] 09/27/2015 3:18 PM

## 2015-09-27 NOTE — Progress Notes (Signed)
[facility] PROGRESS NOTE  Agustus Tobolski [id number] DOB: 05/22/1945    PCP:   No primary care provider on file.   HPI:  Dominic Martinez is an 70 y.o. 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  In the ER, CXR showed recurred left pleural effusion, along with some atelectasis vs PNA.  His Cr was normal.  He was given IV Steroid, and IV Levaquin, along with nebs.and hospitalist was asked to admit him for further work up.  He actually was going get hospice and palliative care consultation, but was not able to get that accomplished.  He expressed today that he is amenable to having Tx, including thoracocentesis, but his most desire was to keep him comfortable.  He is a DNR.  Palliative care medicine saw him, and he would like to have hospice care at home.  His CXR showed very little pleural effusion, so he doesn't need to have it tapped.   Rewiew of Systems:  Constitutional: Negative for malaise, fever and chills. No significant weight loss or weight gain Eyes: Negative for eye pain, redness and discharge, diplopia, visual changes, or flashes of light. ENMT: Negative for ear pain, hoarseness, nasal congestion, sinus pressure and sore throat. No headaches; tinnitus, drooling, or problem swallowing. Cardiovascular: Negative for chest pain, palpitations, diaphoresis,  and peripheral edema. ; No orthopnea, PND Respiratory: Negative for cough, hemoptysis, wheezing and stridor. No pleuritic chestpain. Gastrointestinal: Negative for nausea, vomiting, diarrhea, constipation, abdominal pain, melena, blood in stool, hematemesis, jaundice and rectal bleeding.    Genitourinary: Negative for frequency, dysuria, incontinence,flank pain and hematuria; Musculoskeletal: Negative for back pain and neck pain. Negative for swelling and trauma.;   Skin: . Negative for pruritus, rash, abrasions, bruising and skin lesion.; ulcerations Neuro: Negative for headache, lightheadedness and neck stiffness. Negative for weakness, altered level of consciousness , altered mental status, extremity weakness, burning feet, involuntary movement, seizure and syncope.  Psych: negative for anxiety, depression, insomnia, tearfulness, panic attacks, hallucinations, paranoia, suicidal or homicidal ideation    Past Medical History:  Diagnosis Date  . Back injury 1994  . Cancer ([place])    Lung cancer (left)  . COPD (chronic obstructive pulmonary disease) ([place])   . Gangrene ([place])   . Hypertension   . On home O2   . Pleural effusion on left 01/2015  . Pneumonia     Past Surgical History:  Procedure Laterality Date  . ABDOMINAL SURGERY    . APPENDECTOMY      Medications:  HOME MEDS: Prior to Admission medications   Medication Sig Start Date End Date Taking? Authorizing Provider  amLODipine (NORVASC) 10 MG tablet Take 10 mg by mouth daily. 07/10/15  Yes Historical Provider, MD  atorvastatin (LIPITOR) 80 MG tablet Take 1 tablet (80 mg total) by mouth daily at 6 PM. 02/18/15  Yes Kathie Dike, MD  furosemide (LASIX) 40 MG tablet Take 1 tablet (40 mg total) by mouth daily as needed for fluid. 02/18/15  Yes Kathie Dike, MD  lisinopril-hydrochlorothiazide (PRINZIDE,ZESTORETIC) 20-25 MG tablet Take 1 tablet by mouth daily.   Yes Historical Provider, MD  oxyCODONE-acetaminophen (PERCOCET) 7.5-325 MG tablet Take 1 tablet by mouth every 6 (six) hours as needed for moderate pain or severe pain.   Yes Historical Provider, MD  SYMBICORT 160-4.5 MCG/ACT inhaler Inhale 1 puff into the  lungs 2 (two) times daily. 08/20/15  Yes Historical Provider, MD  tiotropium (SPIRIVA) 18 MCG inhalation capsule Place 1 capsule (18 mcg total) into inhaler and inhale daily. 06/21/12  Yes Bobby Rumpf York, PA-C  acetaminophen (TYLENOL) 500 MG tablet Take 500-1,000 mg by mouth daily  as needed for pain (Only used on occasion for pain).    Historical Provider, MD  albuterol (PROVENTIL HFA;VENTOLIN HFA) 108 (90 BASE) MCG/ACT inhaler Inhale 2 puffs into the lungs every 6 (six) hours as needed for wheezing. 06/21/12   Bobby Rumpf York, PA-C  guaiFENesin (MUCINEX) 600 MG 12 hr tablet Take 1 tablet (600 mg total) by mouth 2 (two) times daily. Patient not taking: Reported on 09/26/2015 06/21/12   Melton Alar, PA-C  ipratropium-albuterol (DUONEB) 0.5-2.5 (3) MG/3ML SOLN Take 3 mLs by nebulization every 6 (six) hours as needed.    Historical Provider, MD     Allergies:  No Known Allergies  Social History:   reports that he has quit smoking. He has a 25.00 pack-year smoking history. He has never used smokeless tobacco. He reports that he does not drink alcohol or use drugs.  Family History: Family History  Problem Relation Age of Onset  . Heart attack Brother   . Coronary artery disease Brother   . Cancer Brother      Physical Exam: Vitals:   09/26/15 2046 09/26/15 2056 09/27/15 0640 09/27/15 0759  BP: (!) 116/58  127/65   Pulse: 92  77   Resp: 16  (!) 22   Temp: 98.6 F (37 C)  98.5 F (36.9 C)   TempSrc: Oral  Oral   SpO2: 100% 100% 100% 96%  Weight:      Height:       Blood pressure 127/65, pulse 77, temperature 98.5 F (36.9 C), temperature source Oral, resp. rate (!) 22, height 5\' 8"  (1.727 m), weight 43.1 kg (95 lb), SpO2 96 %.  GEN:  Pleasant patient lying in the stretcher in no acute distress; cooperative with exam. PSYCH:  alert and oriented x4; does not appear anxious or depressed; affect is appropriate. HEENT: Mucous membranes pink and anicteric; PERRLA; EOM intact; no cervical lymphadenopathy nor thyromegaly or carotid bruit; no JVD; There were no stridor. Neck is very supple. Breasts:: Not examined CHEST WALL: No tenderness CHEST: decrease BS, some wheezing, but no rales.  HEART: Regular rate and rhythm.  There are no murmur, rub, or gallops.    BACK: No kyphosis or scoliosis; no CVA tenderness ABDOMEN: soft and non-tender; no masses, no organomegaly, normal abdominal bowel sounds; no pannus; no intertriginous candida. There is no rebound and no distention. Rectal Exam: Not done EXTREMITIES: No bone or joint deformity; age-appropriate arthropathy of the hands and knees; no edema; no ulcerations.  There is no calf tenderness. Genitalia: not examined PULSES: 2+ and symmetric SKIN: Normal hydration no rash or ulceration CNS: Cranial nerves 2-12 grossly intact no focal lateralizing neurologic deficit.  Speech is fluent; uvula elevated with phonation, facial symmetry and tongue midline. DTR are normal bilaterally, cerebella exam is intact, barbinski is negative and strengths are equaled bilaterally.  No sensory loss.   Labs on Admission:  Basic Metabolic Panel:  Recent Labs Lab 09/26/15 1255  NA 141  K 3.7  CL 80*  CO2 50*  GLUCOSE 158*  BUN 38*  CREATININE 0.60*  CALCIUM 9.7   CBC:  Recent Labs Lab 09/26/15 1255  WBC 14.9*  NEUTROABS 13.6*  HGB 9.4*  HCT 30.8*  MCV  113.7*  PLT 406*   Cardiac Enzymes:  Recent Labs Lab 09/26/15 1255  TROPONINI <0.03   Radiological Exams on Admission: Dg Chest Port 1 View  Result Date: 09/26/2015 CLINICAL DATA:  Worsening weakness and shortness of breath. History of left-sided lung cancer. EXAM: PORTABLE CHEST 1 VIEW COMPARISON:  02/15/2015 FINDINGS: Right chest shows emphysema. Increased density in the right upper lobe could represent pneumonia. On the left, there is a new left effusion with left base volume loss. There is abnormal perihilar density on the left which could represent a combination of mass, collapse and pneumonia. More peripheral density in the left upper lobe could represent volume loss, pneumonia or interstitial spread of cancer. IMPRESSION: New effusion on the left. Left lower lobe collapse. Left perihilar density that could represent infiltrate, collapse or tumor.  Left upper lobe density could represent collapse, pneumonia or interstitial spread of cancer. New density in the right upper lobe that could represent pneumonia. Electronically Signed   By: Nelson Chimes M.D.   On: 09/26/2015 13:13    EKG: Independently reviewed.   Assessment/Plan Present on Admission: . CAP (community acquired pneumonia) . COPD with acute exacerbation ([place]) . A-fib ([place])   PLAN:  End stage COPD with possible superimposed PNA:  Will continue with IV Levaquin.  He is not wheezing significantly, so will not require more steroids.  Appreciate palliative care consultation.  Will implement recommendations.  Hospice referral will be done upon discharge.   Chronic pain syndrome:  Given his preference to focus on comfort measure, He was given IV Morphine.  Will change over to oral morphine.   Left pleural effusion:  Will request IR to perform thoracocentesis if possible.   Other plans as per orders. Code Status: DNR   Orvan Falconer, MD.  FACP [facility] Pager [phone] 7pm to 7am.  09/27/2015, 1:22 PM

## 2015-09-27 NOTE — Care Management Note (Signed)
Case Management Note  Patient Details  Name: Dominic Martinez MRN: [id number] Date of Birth: 02-14-1946  Subjective/Objective:  Patient adm from home, end stage COPD. Lives at home with wife. Has home oxygen [facility], currently on 7L. Patient spoke with palliative care today regarding future wishes. Patient was given list of hospice providers.           Action/Plan: Patient has elected to go home with home hospice services provided by [facility]. Spoke with Hilda Blades, all necessary documents faxed and they will gladly contact patient to set up first appointment.   Expected Discharge Date:  09/28/15               Expected Discharge Plan:  Home w Hospice Care  In-House Referral:  Hospice / Palliative Care  Discharge planning Services  CM Consult  Post Acute Care Choice:  Hospice (at home) Choice offered to:  Patient  DME Arranged:    DME Agency:     HH Arranged:    [place] Agency:     Status of Service:  Completed, signed off  If discussed at [employer], dates discussed:    Additional Comments:  Tonetta Napoles, Chauncey Reading, RN 09/27/2015, 2:14 PM

## 2015-09-27 NOTE — Care Management Note (Signed)
Patient Details  Name: Raeqwon Mccullar MRN: [id number] Date of Birth: Feb 18, 1946  SS #:  [id number]  Trygve, Shively ([id number]) Sex Male DOB 01-06-1946  Room Bed  A324 A324-01  Patient Demographics   Address [place] [address] Phone [phone] (Home) *Preferred*  Patient Ethnicity & Race   Ethnic Group Patient Race  Not Hispanic or Latino White or Caucasian  Emergency Contact(s)   Name Relation Home Work [place] Spouse [phone]  [phone]  [facility] Daughter [phone]  [phone]  Gavin Potters Daughter [phone]    Documents on File    Status Date Received Description  Documents for the Patient  [place] Not Received    [facility] E-Signature HIPAA Notice of Privacy Received [id number]   Driver's License Not Received    Insurance Card Not Received    Advance Directives/Living Will/HCPOA/POA Not Received    Insurance Card Not Received  06/17/2012  HIM [employer] Authorization Not Received  06/21/12 - Sent from our facility (sent via EMS). Need labs, discharge summary, ER visit.  Release of Information Not Received    HIM [employer] Authorization Not Received    Release of Information  07/01/12   Other Photo ID Not Received    Documents for the Encounter  AOB (Assignment of Insurance Benefits) Not Received    E-signature AOB Signed 09/26/15   MEDICARE RIGHTS Not Received    E-signature Medicare Rights Signed 09/26/15   Cardiac Monitoring Strip  09/26/15   EKG  09/27/15   Admission Information   Attending Provider Admitting Provider Admission Type Admission Date/Time  Orvan Falconer, MD Orvan Falconer, MD Emergency 09/26/15 1233  Discharge Date Hospital Service Auth/Cert Status Service Area   Internal Medicine Incomplete [place]  Unit Room/Bed Admission Status   AP-DEPT 300 A324/A324-01 Admission (Confirmed)   Admission   Complaint  SOB  Hospital Account   Name Acct ID Class Status  Primary Coverage  Maxus, Ruoff [id number] Inpatient Open MEDICARE - MEDICARE PART A AND B      Guarantor Account (for Hospital Account [id number])   Name Relation to Pt Service Area Active? Acct Type  [place] Self [facility] Yes Personal/Family  Address Phone    [place], [place] [phone]        Coverage Information (for Hospital Account [id number])   1. [place] PART A AND B   F/O Payor/Plan Precert #  MEDICARE/MEDICARE PART A AND B   Subscriber Subscriber #  Kel, Amadon [id number] [facility]  Address Phone  [address] [place], [place] [address]   2. MEDICAID [place]/MEDICAID OF [place]   F/O Payor/Plan Precert #  MEDICAID [place]/MEDICAID OF [place]   Subscriber Subscriber #  Gay, Peeters [id number] [facility]  Address Phone  [address] [place], [place] [address] [phone]

## 2015-09-27 NOTE — Discharge Summary (Signed)
Physician Discharge Summary  [name] [id number] DOB: 1945-06-23 DOA: 09/26/2015  PCP: No primary care provider on file.  Admit date: 09/26/2015 Discharge date: 09/27/2015  Time spent:60 minutes  Recommendations for Outpatient Follow-up:  1. Follow up with PCP next week, or physician with hospice service.    Discharge Diagnoses:  Principal Problem:   CAP (community acquired pneumonia) Active Problems:   COPD with acute exacerbation ([place])   A-fib ([place])   Palliative care encounter   Goals of care, counseling/discussion   DNR (do not resuscitate) discussion   Discharge Condition:  Improved.   Diet recommendation: as tolerated.   Filed Weights   09/26/15 1237 09/26/15 1653  Weight: 43.1 kg (95 lb) 43.1 kg (95 lb)    History of present illness: Patient was admitted by me on Aug 3rd, 2017 for SOB with endstage COPD and possibly lung CA.  As per my prior H and P:  " Dominic Martinez is an 70 y.o. 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  In the ER, CXR showed recurred left pleural effusion, along with some atelectasis vs PNA.  His Cr was normal.  He was given IV Steroid, and IV Levaquin, along with nebs.and hospitalist was asked to admit him for further work up.  He actually was going get hospice and palliative care consultation, but was not able to get that accomplished.  He expressed today that he is amenable to having Tx, including thoracocentesis, but his most desire was to keep him comfortable.  He is a DNR.    Hospital Course:  Patient was given nebs, and IV morphine 2mg  Q 2 hours, and he felt significantly better.  He did not have pain on his back and he was no longer SOB.  The original plan for IR thoracocentesis was cancelled as his left pleural effusion was very small, and that he felt better and did not require  a tap.  He was given IV levoquin, but I am not convinced he had PNA.  He was given one dose of steroid IV, but did not require further steroid.  Long discussion with him, and his wife and family, and he clearly and adamantly requested that he be treated for comfort care and would like to have hospice service.  Palliative care medicine consult was requested, and Aniceto Boss had seen him, and recommended home hospice service, along with suggesting morphine liquid.  He appears quite comfortable.  He told his family in no uncertain term that he would like to go home today.  Hospice has been set up and had spoken with his daughter, and will see him after he goes home.  I will give him 15 cc of concentrated morphine, to be used at 0.13 cc QID, 15 cc total given, and requested that his wife administer it to him as needed.  He doesn't require statin, steroids, or antibiotics, so they were discontinued.  All knew that he has reach endstage of his diease, but he is clearly not actively dying.  He appears content and had no questions for requests.  He will follow up with his PCP or the hospice physician.  He had seen Dr Standley Dakins in the past, but did not want to see him in follow up.  His wishes were all honored.  Thank you and Good Day.  Discharge Exam: Vitals:   09/27/15 0640 09/27/15 1330  BP: 127/65 115/60  Pulse: 77 94  Resp: (!) 22   Temp: 98.5 F (36.9 C) 98.3 F (36.8 C)   Discharge Instructions   Discharge Instructions    Diet - low sodium heart healthy    Complete by:  As directed   Discharge instructions    Complete by:  As directed   Have wife administer his medication, especially the morphine. Follow up with hospice physician or PCP Monday morning.   Increase activity slowly    Complete by:  As directed     Current Discharge Medication List    START taking these medications   Details  Morphine Sulfate (MORPHINE CONCENTRATE) 10 MG/0.5ML SOLN concentrated solution Take 0.13 mLs (2.6 mg total)  by mouth every 4 (four) hours as needed for moderate pain, severe pain or shortness of breath. Qty: 15 mL, Refills: 0    senna-docusate (SENOKOT-S) 8.6-50 MG tablet Take 1 tablet by mouth 2 (two) times daily. Qty: 60 tablet, Refills: 1      CONTINUE these medications which have CHANGED   Details  aspirin EC 81 MG EC tablet Take 1 tablet (81 mg total) by mouth daily. Qty: 100 tablet, Refills: 1      CONTINUE these medications which have NOT CHANGED   Details  amLODipine (NORVASC) 10 MG tablet Take 10 mg by mouth daily.    atorvastatin (LIPITOR) 80 MG tablet Take 1 tablet (80 mg total) by mouth daily at 6 PM. Qty: 30 tablet, Refills: 6    furosemide (LASIX) 40 MG tablet Take 1 tablet (40 mg total) by mouth daily as needed for fluid. Qty: 30 tablet, Refills: 0    SYMBICORT 160-4.5 MCG/ACT inhaler Inhale 1 puff into the lungs 2 (two) times daily.    tiotropium (SPIRIVA) 18 MCG inhalation capsule Place 1 capsule (18 mcg total) into inhaler and inhale daily. Qty: 30 capsule, Refills: 6    acetaminophen (TYLENOL) 500 MG tablet Take 500-1,000 mg by mouth daily as needed for pain (Only used on occasion for pain).    albuterol (PROVENTIL HFA;VENTOLIN HFA) 108 (90 BASE) MCG/ACT inhaler Inhale 2 puffs into the lungs every 6 (six) hours as needed for wheezing. Qty: 1 Inhaler, Refills: 6    guaiFENesin (MUCINEX) 600 MG 12 hr tablet Take 1 tablet (600 mg total) by mouth 2 (two) times daily.    ipratropium-albuterol (DUONEB) 0.5-2.5 (3) MG/3ML SOLN Take 3 mLs by nebulization every 6 (six) hours as needed.      STOP taking these medications     lisinopril-hydrochlorothiazide (PRINZIDE,ZESTORETIC) 20-25 MG tablet      oxyCODONE-acetaminophen (PERCOCET) 7.5-325 MG tablet        No Known Allergies    The results of significant diagnostics from this hospitalization (including imaging, microbiology, ancillary and laboratory) are listed below for reference.    Significant Diagnostic  Studies: Dg Chest Port 1 View  Result Date: 09/26/2015 CLINICAL DATA:  Worsening weakness and shortness of breath. History of left-sided lung cancer. EXAM: PORTABLE CHEST 1 VIEW COMPARISON:  02/15/2015 FINDINGS: Right chest shows emphysema. Increased density in the right upper lobe could represent pneumonia. On the left, there is a new left effusion with left base volume loss. There is abnormal perihilar density on the left which could represent a combination of mass, collapse and pneumonia. More peripheral density in the left upper lobe could represent volume loss, pneumonia or interstitial spread of cancer. IMPRESSION: New effusion on the  left. Left lower lobe collapse. Left perihilar density that could represent infiltrate, collapse or tumor. Left upper lobe density could represent collapse, pneumonia or interstitial spread of cancer. New density in the right upper lobe that could represent pneumonia. Electronically Signed   By: Nelson Chimes M.D.   On: 09/26/2015 13:13    Microbiology: No results found for this or any previous visit (from the past 240 hour(s)).   Labs: Basic Metabolic Panel:  Recent Labs Lab 09/26/15 1255  NA 141  K 3.7  CL 80*  CO2 50*  GLUCOSE 158*  BUN 38*  CREATININE 0.60*  CALCIUM 9.7   CBC:  Recent Labs Lab 09/26/15 1255  WBC 14.9*  NEUTROABS 13.6*  HGB 9.4*  HCT 30.8*  MCV 113.7*  PLT 406*   Cardiac Enzymes:  Recent Labs Lab 09/26/15 1255  TROPONINI <0.03   BNP: BNP (last 3 results)  Recent Labs  02/07/15 1634 09/26/15 1255  BNP 129.0* 114.0*    Signed:  Oshua Mcconaha MD.  [facility] 09/27/2015, 4:29 PM

## 2015-09-29 NOTE — Care Management (Signed)
CM able to fax d/c summary to [facility] for review.

## 2015-10-25 DEATH — deceased

## 2017-12-21 IMAGING — US US THORACENTESIS ASP PLEURAL SPACE W/IMG GUIDE
1 series · 3 of 3 positions shown · non-contrast
Comparison: Chest radiograph 02/14/2015

CLINICAL DATA: BILATERAL pleural effusions, acute respiratory
failure with hypoxia, COPD exacerbation, significant O2 requirements

EXAM:
ULTRASOUND GUIDED DIAGNOSTIC AND THERAPEUTIC LEFT THORACENTESIS

[Series 1: us thoracentesis asp pleural space w/img guide · 0.22mm/px · 3 of 3 slices shown]
[im 1/3]
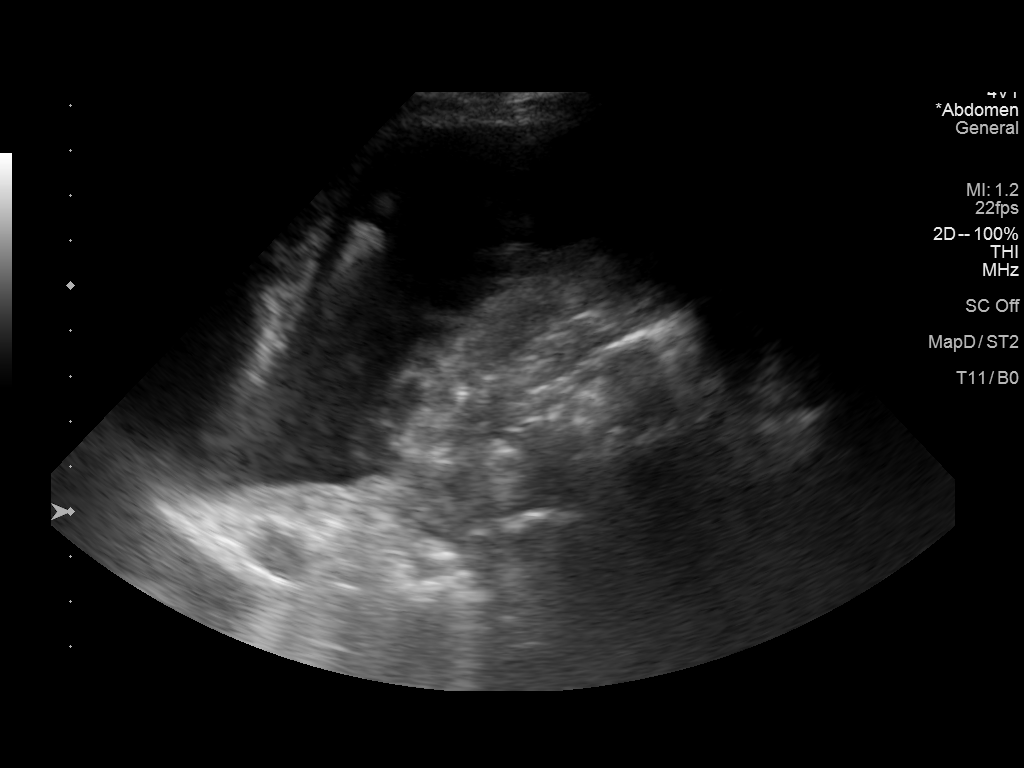
[im 2/3]
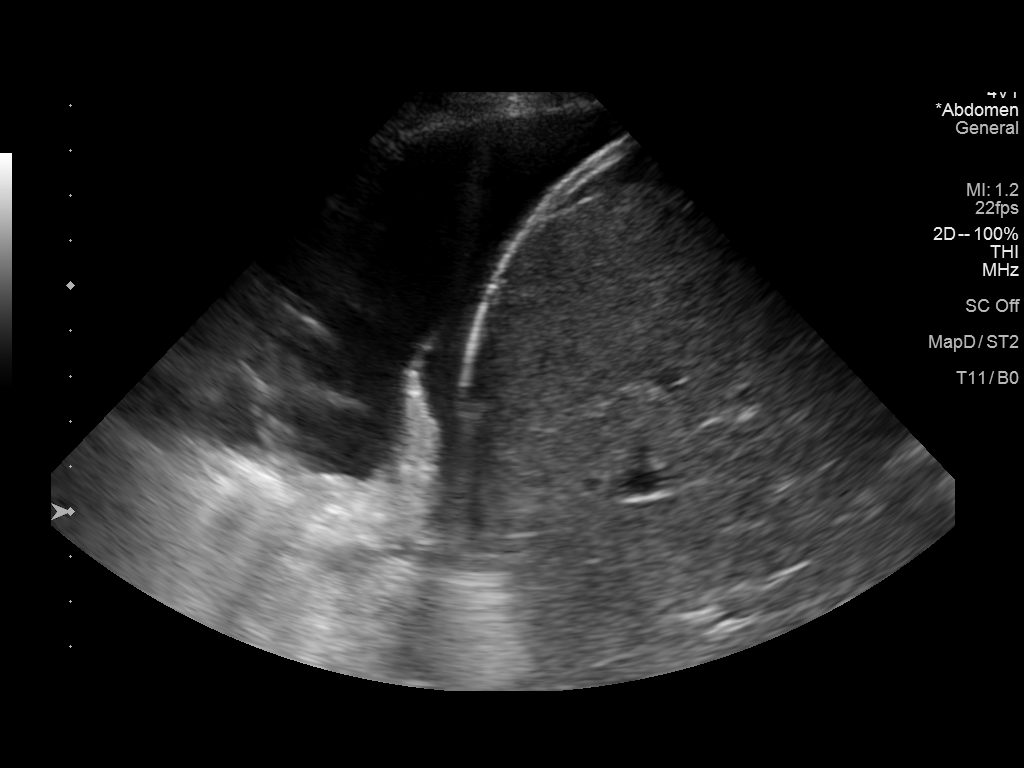
[im 3/3]
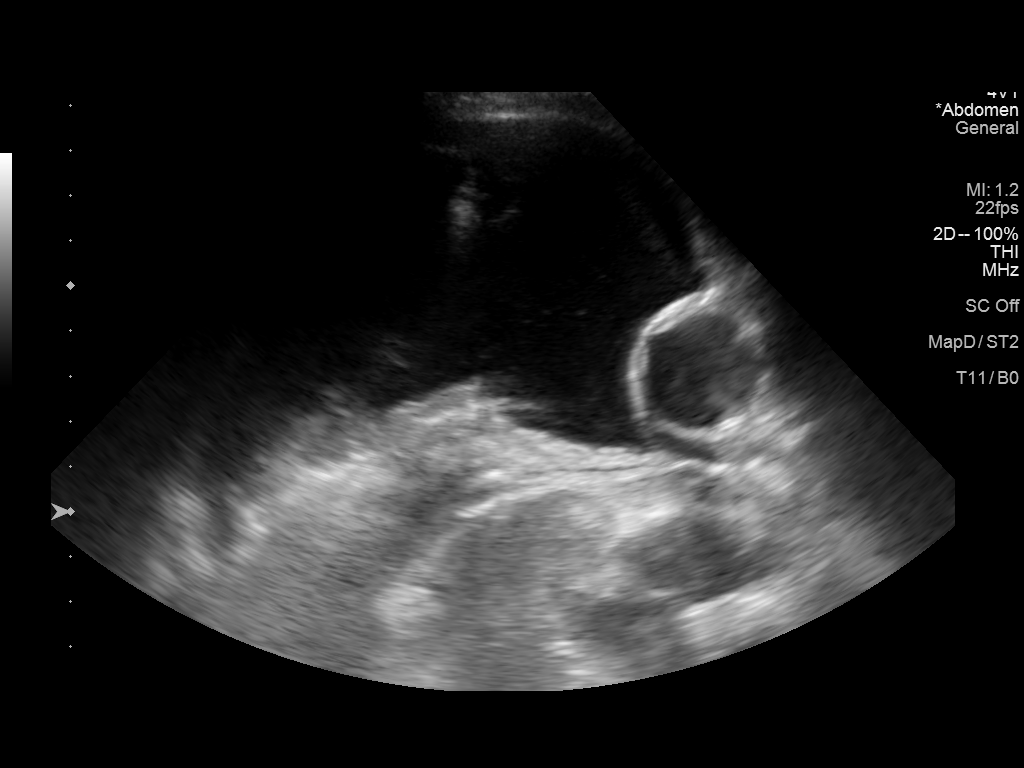

[3 of 3 positions shown; findings below may reference images not displayed]

PROCEDURE:
Procedure, benefits, and risks of procedure were discussed with
patient.

Written informed consent for procedure was obtained.

Time out protocol followed.

Pleural effusion localized by ultrasound at the posterior LEFT
hemithorax.

Skin prepped and draped in usual sterile fashion.

Skin and soft tissues anesthetized with 7 mL of 1% lidocaine.

8 French thoracentesis catheter placed into the LEFT pleural space.

7383 mL of thin serosanguineous to red colored fluid aspirated by
syringe pump.

Procedure tolerated well by patient without immediate complication.

COMPLICATIONS:
None
FINDINGS: A total of approximately 7383 mL of LEFT pleural fluid was removed.
A fluid sample of 180 mL was sent for laboratory analysis.
IMPRESSION: Successful ultrasound guided LEFT thoracentesis yielding 7383 mL of
pleural fluid.

## 2017-12-21 IMAGING — DX DG CHEST 1V
2 series · 2 of 2 positions shown · non-contrast
Comparison: 02/14/2015

CLINICAL DATA: BILATERAL pleural effusions post LEFT thoracentesis

EXAM:
CHEST 1 VIEW

[chest ap (1 of 2)]
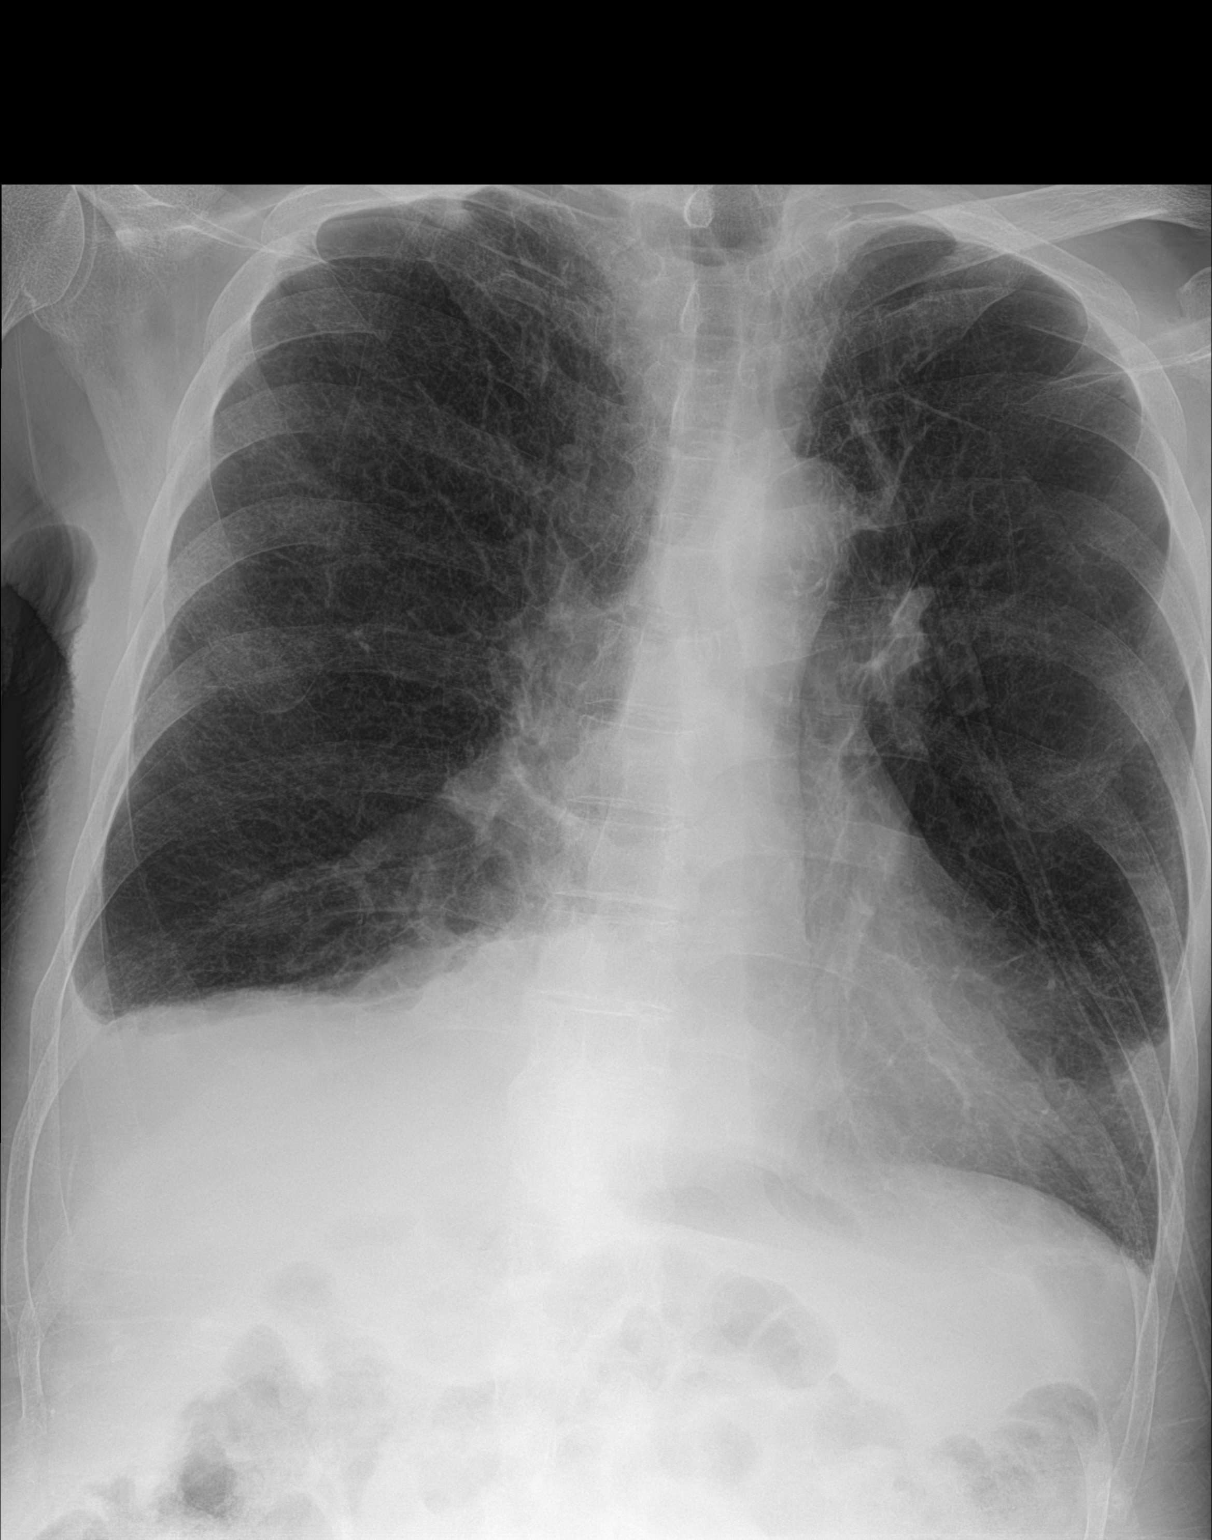

[chest ap (2 of 2)]
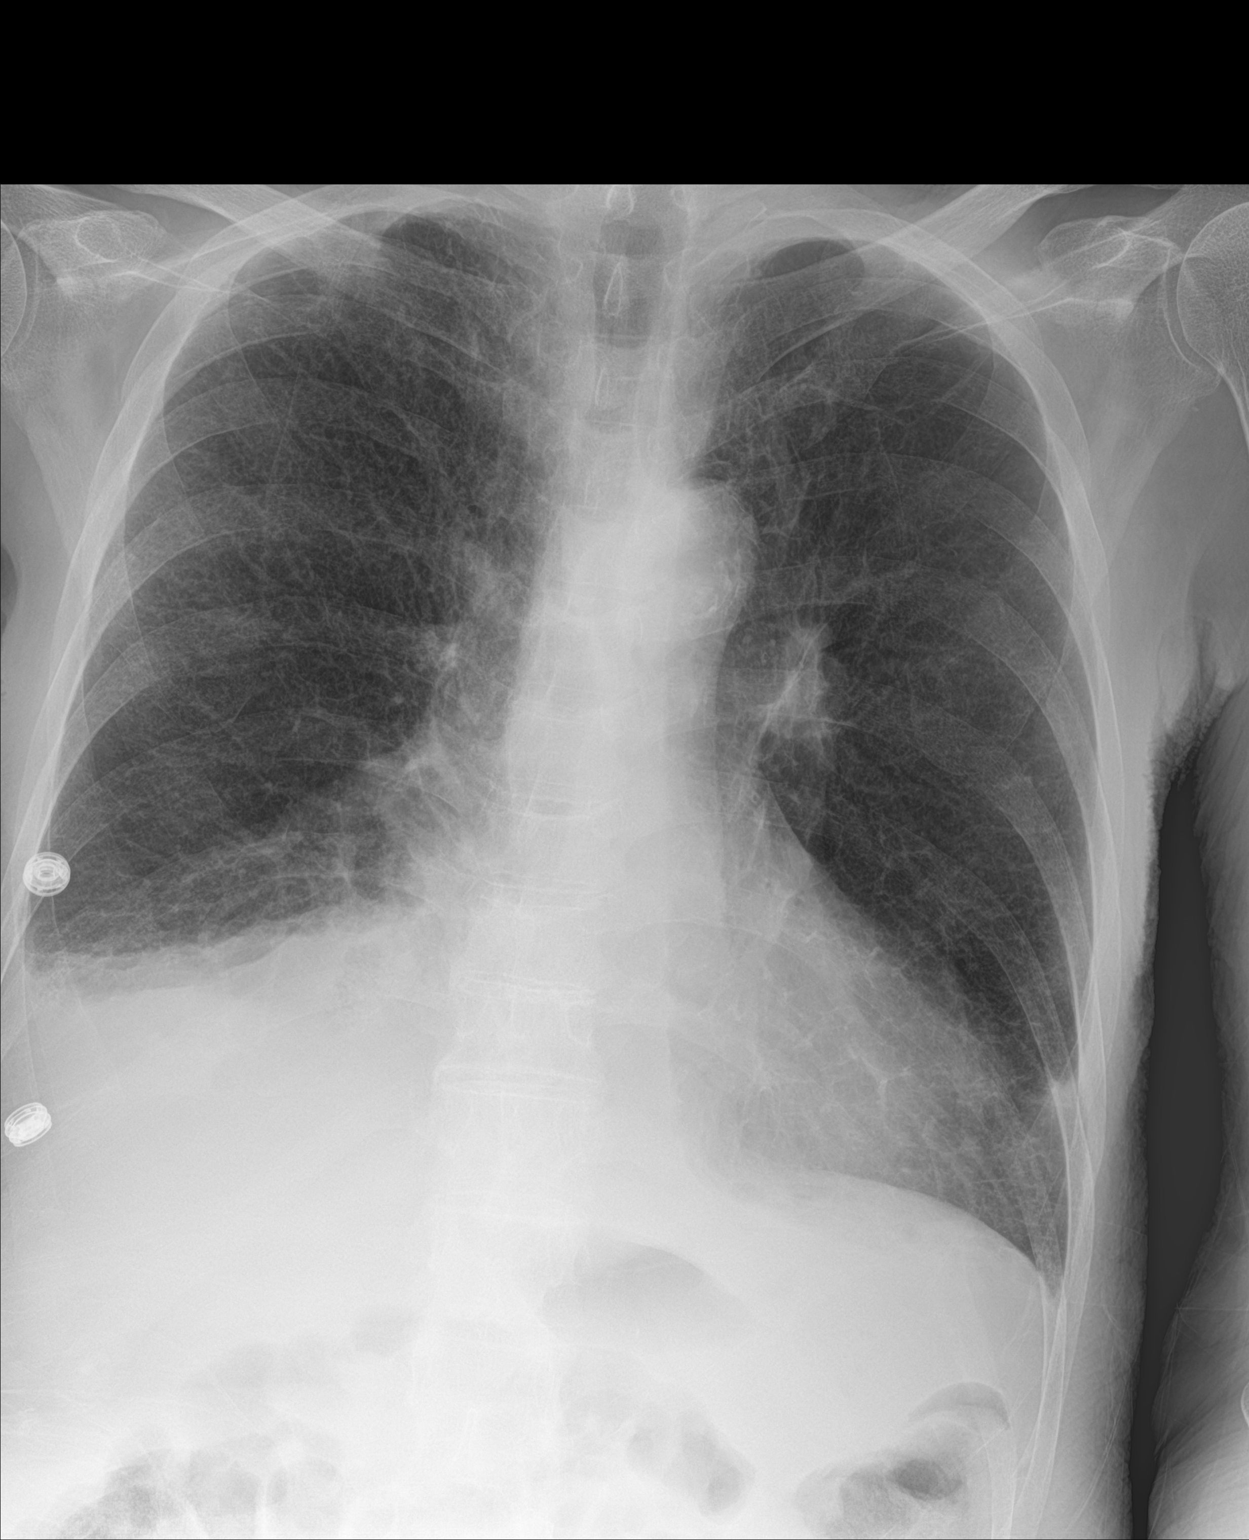

[2 of 2 positions shown; findings below may reference images not displayed]

FINDINGS: Upper normal heart size.

Atherosclerotic calcification aorta.

Mediastinal contours normal vascularity normal.

Near complete resolution of previously identified LEFT pleural
effusion post thoracentesis.

Persistent RIGHT pleural effusion and basilar atelectasis.

No pneumothorax.

Skin fold projects over the upper LEFT chest.

Underlying emphysematous, bronchitic and chronic interstitial
changes.
IMPRESSION: COPD changes with persistent RIGHT pleural effusion and basilar
atelectasis.

Near complete resolution of LEFT pleural effusion and basilar
atelectasis post thoracentesis without evidence of pneumothorax.

Patient indicates slightly improved ease of breathing post
thoracentesis.

## 2018-08-01 IMAGING — CR DG CHEST 1V PORT
1 series · 1 of 1 positions shown · non-contrast
Comparison: 02/15/2015

CLINICAL DATA: Worsening weakness and shortness of breath. History
of left-sided lung cancer.

EXAM:
PORTABLE CHEST 1 VIEW

[ap portable]
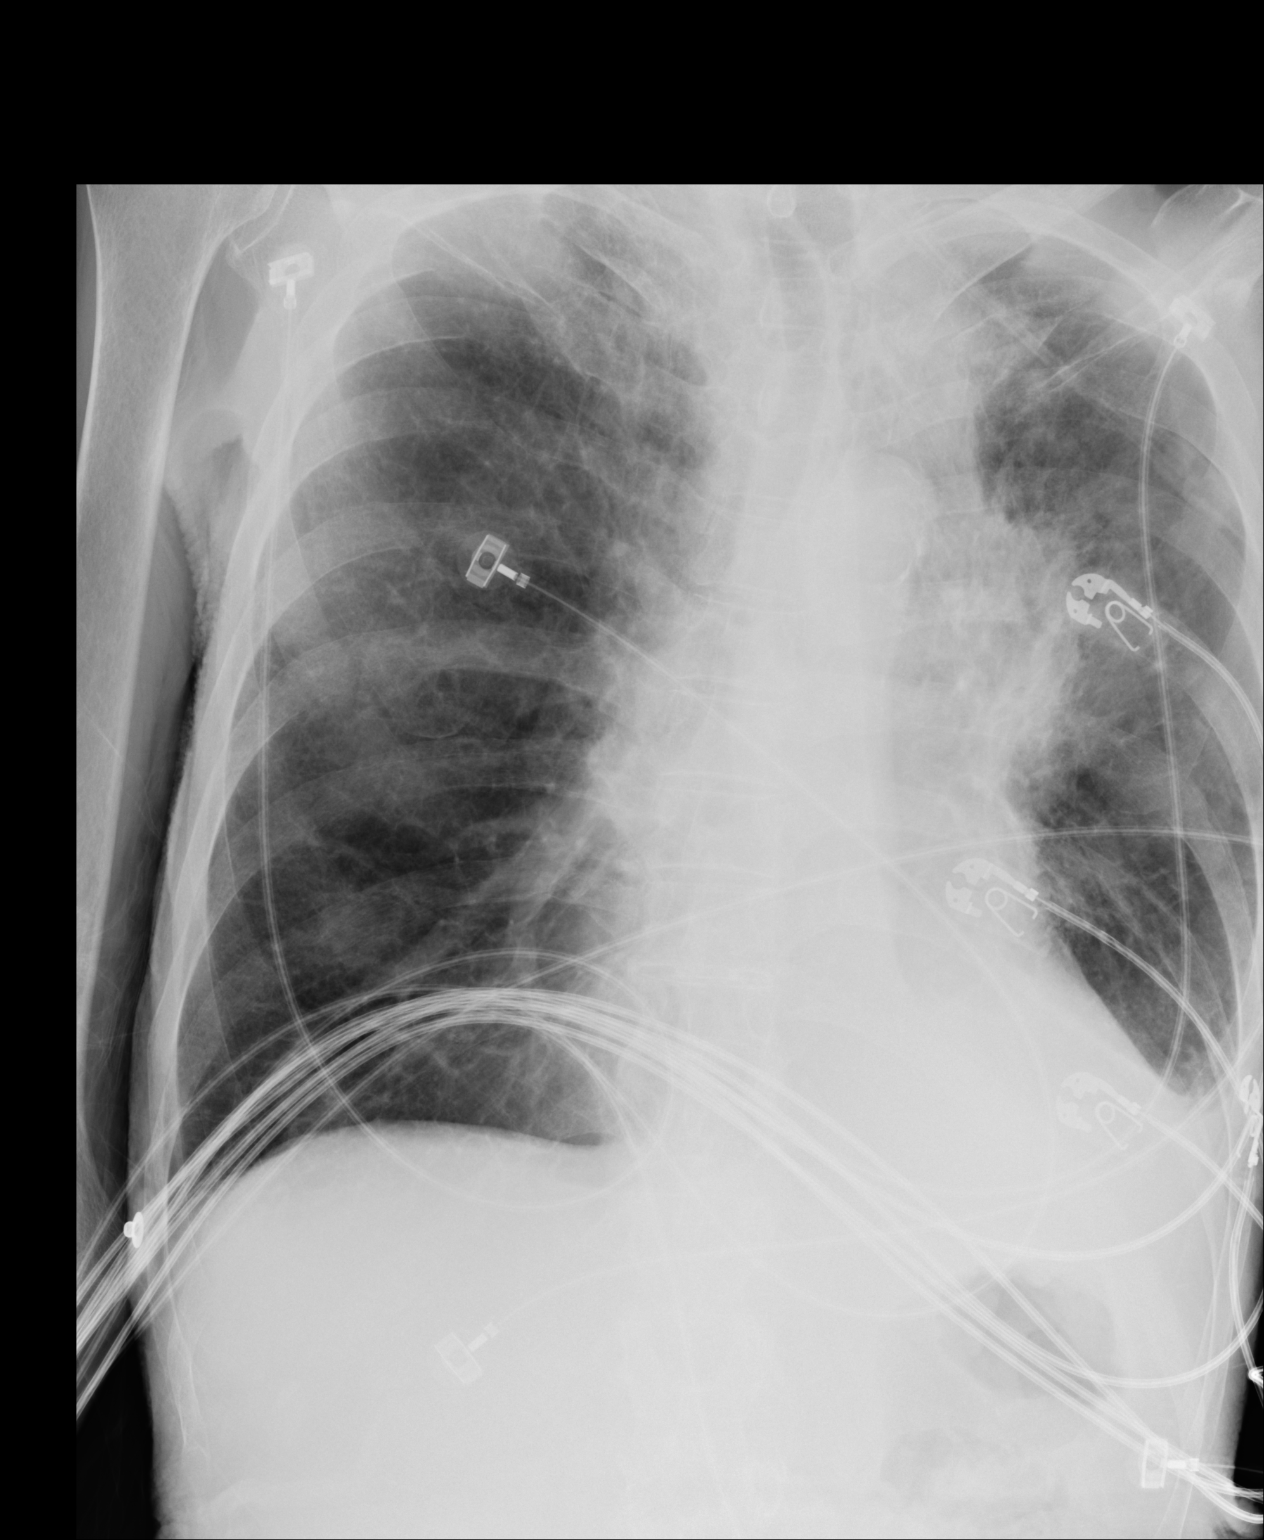

[1 of 1 positions shown; findings below may reference images not displayed]

FINDINGS: Right chest shows emphysema. Increased density in the right upper
lobe could represent pneumonia. On the left, there is a new left
effusion with left base volume loss. There is abnormal perihilar
density on the left which could represent a combination of mass,
collapse and pneumonia. More peripheral density in the left upper
lobe could represent volume loss, pneumonia or interstitial spread
of cancer.
IMPRESSION: New effusion on the left. Left lower lobe collapse. Left perihilar
density that could represent infiltrate, collapse or tumor. Left
upper lobe density could represent collapse, pneumonia or
interstitial spread of cancer.

New density in the right upper lobe that could represent pneumonia.
